# Patient Record
Sex: Female | Born: 1980 | Race: White | Hispanic: No | Marital: Married | Smoking: Former smoker
Health system: Southern US, Community
[De-identification: ages and names within clinical notes are randomized; demographics above are authoritative.]

---

## 2016-06-13 ENCOUNTER — Encounter: Payer: Self-pay | Admitting: *Deleted

## 2016-06-13 ENCOUNTER — Emergency Department
Admission: EM | Admit: 2016-06-13 | Discharge: 2016-06-13 | Disposition: A | Discharge status: Home / Self Care | Payer: 59 | Attending: Emergency Medicine | Admitting: Emergency Medicine

## 2016-06-13 DIAGNOSIS — F17210 Nicotine dependence, cigarettes, uncomplicated: Secondary | ICD-10-CM | POA: Diagnosis not present

## 2016-06-13 DIAGNOSIS — R112 Nausea with vomiting, unspecified: Secondary | ICD-10-CM

## 2016-06-13 DIAGNOSIS — E860 Dehydration: Secondary | ICD-10-CM | POA: Insufficient documentation

## 2016-06-13 DIAGNOSIS — R197 Diarrhea, unspecified: Secondary | ICD-10-CM

## 2016-06-13 LAB — URINALYSIS COMPLETE WITH MICROSCOPIC (ARMC ONLY)
BILIRUBIN URINE: NEGATIVE
Bacteria, UA: NONE SEEN
Glucose, UA: 50 mg/dL — AB
Leukocytes, UA: NEGATIVE
Nitrite: NEGATIVE
PH: 6 (ref 5.0–8.0)
PROTEIN: 30 mg/dL — AB
Specific Gravity, Urine: 1.027 (ref 1.005–1.030)

## 2016-06-13 LAB — CBC WITH DIFFERENTIAL/PLATELET
Basophils Absolute: 0 10*3/uL (ref 0–0.1)
Basophils Relative: 0 %
EOS ABS: 0 10*3/uL (ref 0–0.7)
EOS PCT: 0 %
HCT: 42.2 % (ref 35.0–47.0)
HEMOGLOBIN: 14.3 g/dL (ref 12.0–16.0)
LYMPHS ABS: 0.9 10*3/uL — AB (ref 1.0–3.6)
Lymphocytes Relative: 7 %
MCH: 28.8 pg (ref 26.0–34.0)
MCHC: 34 g/dL (ref 32.0–36.0)
MCV: 84.8 fL (ref 80.0–100.0)
MONO ABS: 0.3 10*3/uL (ref 0.2–0.9)
MONOS PCT: 2 %
NEUTROS PCT: 91 %
Neutro Abs: 12.7 10*3/uL — ABNORMAL HIGH (ref 1.4–6.5)
Platelets: 183 10*3/uL (ref 150–440)
RBC: 4.98 MIL/uL (ref 3.80–5.20)
RDW: 14 % (ref 11.5–14.5)
WBC: 14 10*3/uL — ABNORMAL HIGH (ref 3.6–11.0)

## 2016-06-13 LAB — BASIC METABOLIC PANEL
Anion gap: 9 (ref 5–15)
BUN: 14 mg/dL (ref 6–20)
CALCIUM: 9.4 mg/dL (ref 8.9–10.3)
CHLORIDE: 105 mmol/L (ref 101–111)
CO2: 23 mmol/L (ref 22–32)
CREATININE: 0.69 mg/dL (ref 0.44–1.00)
GFR calc Af Amer: >60 mL/min (ref >60)
GFR calc non Af Amer: >60 mL/min (ref >60)
Glucose, Bld: 173 mg/dL — ABNORMAL HIGH (ref 65–99)
Potassium: 4.3 mmol/L (ref 3.5–5.1)
SODIUM: 137 mmol/L (ref 135–145)

## 2016-06-13 LAB — POCT PREGNANCY, URINE: Preg Test, Ur: NEGATIVE

## 2016-06-13 LAB — MAGNESIUM: MAGNESIUM: 2.1 mg/dL (ref 1.7–2.4)

## 2016-06-13 MED ORDER — PROMETHAZINE HCL 25 MG/ML IJ SOLN
INTRAMUSCULAR | Status: AC
  Administered 2016-06-13: 12.5 mg via INTRAVENOUS
  Filled 2016-06-13: qty 1

## 2016-06-13 MED ORDER — KETOROLAC TROMETHAMINE 30 MG/ML IJ SOLN
30.0000 mg | Freq: Once | INTRAMUSCULAR | Status: AC
  Administered 2016-06-13: 30 mg via INTRAVENOUS
  Filled 2016-06-13: qty 1

## 2016-06-13 MED ORDER — ONDANSETRON HCL 4 MG/2ML IJ SOLN
4.0000 mg | Freq: Once | INTRAMUSCULAR | Status: AC
  Administered 2016-06-13: 4 mg via INTRAVENOUS
  Filled 2016-06-13: qty 2

## 2016-06-13 MED ORDER — METOCLOPRAMIDE HCL 5 MG/ML IJ SOLN
10.0000 mg | Freq: Once | INTRAMUSCULAR | Status: AC
  Administered 2016-06-13: 10 mg via INTRAVENOUS
  Filled 2016-06-13: qty 2

## 2016-06-13 MED ORDER — SODIUM CHLORIDE 0.9 % IV BOLUS (SEPSIS)
2000.0000 mL | Freq: Once | INTRAVENOUS | Status: AC
  Administered 2016-06-13: 2000 mL via INTRAVENOUS

## 2016-06-13 MED ORDER — PROMETHAZINE HCL 25 MG/ML IJ SOLN
12.5000 mg | Freq: Once | INTRAMUSCULAR | Status: AC
  Administered 2016-06-13: 12.5 mg via INTRAVENOUS

## 2016-06-13 MED ORDER — METOCLOPRAMIDE HCL 10 MG PO TABS: 10.0000 mg | ORAL_TABLET | Freq: Three times a day (TID) | ORAL | 1 refills | Status: DC

## 2016-06-13 NOTE — ED Provider Notes (Signed)
[facility] Emergency Department Provider Note  ____________________________________________  Time seen: Approximately 7:32 AM  I have reviewed the triage vital signs and the nursing notes.   HISTORY  Chief Complaint Emesis   HPI Eileen Weber is a 35 y.o. female no significant past medical history who presents for evaluation of nausea, vomiting, and diarrhea. Patient reports that she has had more than 20 episodes of nonbloody nonbilious emesis and 4-5 episodes of watery diarrhea. She reports that she seemed bright red blood in her diarrhea but no blood or coffee ground emesis. She denies abdominal pain. She endorses chills and body aches. Denies fever, dysuria, hematuria, chest pain, shortness of breath. She reports a one of her coworkers is sick and that her son was sick a few weeks ago. No recent antibiotic use. She hasn't tried anything at home for these symptoms. She reports that she's been having vomiting and diarrhea since yesterday 10 PM.  History reviewed. No pertinent past medical history.  There are no active problems to display for this patient.   History reviewed. No pertinent surgical history.  Prior to Admission medications   Medication Sig Start Date End Date Taking? Authorizing Provider  metoCLOPramide (REGLAN) 10 MG tablet Take 1 tablet (10 mg total) by mouth 3 (three) times daily with meals. 06/13/16 06/13/17  Nita Sickle, MD    Allergies Sulfa antibiotics  No family history on file.  Social History Social History  Substance Use Topics  . Smoking status: Current Every Day Smoker    Packs/day: 1.00    Types: Cigarettes  . Smokeless tobacco: Current User  . Alcohol use No    Review of Systems  Constitutional: Negative for fever. + chills, body aches Eyes: Negative for visual changes. ENT: Negative for sore throat. Cardiovascular: Negative for chest pain. Respiratory: Negative for shortness of breath. Gastrointestinal:  Negative for abdominal pain. +vomiting and diarrhea. Genitourinary: Negative for dysuria. Musculoskeletal: Negative for back pain. Skin: Negative for rash. Neurological: Negative for headaches, weakness or numbness.  ____________________________________________   PHYSICAL EXAM:  VITAL SIGNS: ED Triage Vitals  Enc Vitals Group     BP 06/13/16 0714 127/69     Pulse Rate 06/13/16 0714 (!) 57     Resp --      Temp 06/13/16 0714 97.5 F (36.4 C)     Temp Source 06/13/16 0714 Oral     SpO2 06/13/16 0714 100 %     Weight 06/13/16 0709 120 lb (54.4 kg)     Height 06/13/16 0709 5\' 6"  (1.676 m)     Head Circumference --      Peak Flow --      Pain Score 06/13/16 0709 5     Pain Loc --      Pain Edu? --      Excl. in GC? --     Constitutional: Alert and oriented. Well appearing and in no apparent distress. HEENT:      Head: Normocephalic and atraumatic.         Eyes: Conjunctivae are normal. Sclera is non-icteric. EOMI. PERRL      Mouth/Throat: Mucous membranes are dry.       Neck: Supple with no signs of meningismus. Cardiovascular: Regular rate and rhythm. No murmurs, gallops, or rubs. 2+ symmetrical distal pulses are present in all extremities. No JVD. Respiratory: Normal respiratory effort. Lungs are clear to auscultation bilaterally. No wheezes, crackles, or rhonchi.  Gastrointestinal: Soft, non tender, and non distended with positive bowel sounds.  No rebound or guarding. Genitourinary: No CVA tenderness. Musculoskeletal: Nontender with normal range of motion in all extremities. No edema, cyanosis, or erythema of extremities. Neurologic: Normal speech and language. Face is symmetric. Moving all extremities. No gross focal neurologic deficits are appreciated. Skin: Skin is warm, dry and intact. No rash noted. Psychiatric: Mood and affect are normal. Speech and behavior are normal.  ____________________________________________   LABS (all labs ordered are listed, but only  abnormal results are displayed)  Labs Reviewed  CBC WITH DIFFERENTIAL/PLATELET - Abnormal; Notable for the following:       Result Value   WBC 14.0 (*)    Neutro Abs 12.7 (*)    Lymphs Abs 0.9 (*)    All other components within normal limits  BASIC METABOLIC PANEL - Abnormal; Notable for the following:    Glucose, Bld 173 (*)    All other components within normal limits  URINALYSIS COMPLETEWITH MICROSCOPIC ([facility] ONLY) - Abnormal; Notable for the following:    Color, Urine YELLOW (*)    APPearance CLEAR (*)    Glucose, UA 50 (*)    Ketones, ur 1+ (*)    Hgb urine dipstick 1+ (*)    Protein, ur 30 (*)    Squamous Epithelial / LPF 0-5 (*)    All other components within normal limits  MAGNESIUM  POC URINE PREG, ED  POCT PREGNANCY, URINE   ____________________________________________  EKG  none ____________________________________________  RADIOLOGY  none  ____________________________________________   PROCEDURES  Procedure(s) performed: None Procedures Critical Care performed:  None ____________________________________________   INITIAL IMPRESSION / ASSESSMENT AND PLAN / ED COURSE  35 y.o. female no significant past medical history who presents for evaluation of nausea, vomiting, diarrhea, chills, and body aches since yesterday 10PM. Patient is well appearing, looks dehydrated, vital signs are within normal limits, her abdomen is soft and nontender throughout, remaining of her exam is within normal limits. Presentation concerning for gastroenteritis. Plan for CBC, BMP, magnesium, UA, U pregnant. We'll treat her symptoms with IV fluids, IV Zofran, and IV Toradol.  Clinical Course  Comment By Time  Patient reports that she feels markedly improved. Tolerating by mouth. No further episodes fo diarrhea here. She is requesting to go home. Labs here within normal limits. We'll discharge home with a prescription for anti-medics. Nita Sicklearolina Carma Dwiggins, MD 08/30 1201     Pertinent labs & imaging results that were available during my care of the patient were reviewed by me and considered in my medical decision making (see chart for details).    ____________________________________________   FINAL CLINICAL IMPRESSION(S) / ED DIAGNOSES  Final diagnoses:  Nausea vomiting and diarrhea  Dehydration      NEW MEDICATIONS STARTED DURING THIS VISIT:  New Prescriptions   METOCLOPRAMIDE (REGLAN) 10 MG TABLET    Take 1 tablet (10 mg total) by mouth 3 (three) times daily with meals.     Note:  This document was prepared using Dragon voice recognition software and may include unintentional dictation errors.    Nita Sicklearolina Renatha Rosen, MD 06/13/16 [phone]

## 2016-06-13 NOTE — ED Triage Notes (Signed)
Pt reports vomiting since last night at 10pm with diarrhea, body aches and chills

## 2016-06-13 NOTE — Discharge Instructions (Signed)
Drink plenty of fluids to prevent it from getting dehydrated. Take nausea medication as prescribed. Advance your diet slowly. Follow-up with primary care doctor in 3 days. Return to the emergency room if you have bloody diarrhea, Abdominal pain, or if you're unable to keep yourself hydrated at home.

## 2016-06-13 NOTE — ED Notes (Signed)
Pt c/o nausea returning, MD notified.

## 2017-06-05 ENCOUNTER — Emergency Department
Admission: EM | Admit: 2017-06-05 | Discharge: 2017-06-05 | Disposition: A | Discharge status: Home / Self Care | Payer: 59 | Attending: Emergency Medicine | Admitting: Emergency Medicine

## 2017-06-05 ENCOUNTER — Encounter: Payer: Self-pay | Admitting: Emergency Medicine

## 2017-06-05 DIAGNOSIS — K047 Periapical abscess without sinus: Secondary | ICD-10-CM | POA: Diagnosis not present

## 2017-06-05 DIAGNOSIS — K0889 Other specified disorders of teeth and supporting structures: Secondary | ICD-10-CM

## 2017-06-05 DIAGNOSIS — Z79899 Other long term (current) drug therapy: Secondary | ICD-10-CM | POA: Diagnosis not present

## 2017-06-05 DIAGNOSIS — F17210 Nicotine dependence, cigarettes, uncomplicated: Secondary | ICD-10-CM | POA: Insufficient documentation

## 2017-06-05 MED ORDER — IBUPROFEN 800 MG PO TABS: 800.0000 mg | ORAL_TABLET | Freq: Three times a day (TID) | ORAL | 0 refills | Status: DC | PRN

## 2017-06-05 MED ORDER — AMOXICILLIN 500 MG PO TABS: 500.0000 mg | ORAL_TABLET | Freq: Two times a day (BID) | ORAL | 0 refills | Status: DC

## 2017-06-05 MED ORDER — CHLORHEXIDINE GLUCONATE 0.12 % MT SOLN: 15.0000 mL | Freq: Two times a day (BID) | OROMUCOSAL | 0 refills | Status: DC

## 2017-06-05 MED ORDER — CHLORHEXIDINE GLUCONATE 0.12 % MT SOLN: 15.0000 mL | Freq: Two times a day (BID) | OROMUCOSAL | 0 refills | Status: AC

## 2017-06-05 MED ORDER — AMOXICILLIN-POT CLAVULANATE 875-125 MG PO TABS: 1.0000 | ORAL_TABLET | Freq: Two times a day (BID) | ORAL | 0 refills | Status: DC

## 2017-06-05 MED ORDER — LIDOCAINE-EPINEPHRINE 2 %-1:100000 IJ SOLN
1.7000 mL | Freq: Once | INTRAMUSCULAR | Status: DC
  Filled 2017-06-05: qty 1.7

## 2017-06-05 NOTE — Discharge Instructions (Signed)
OPTIONS FOR DENTAL FOLLOW UP CARE ° °[place] Department of Health and Human Services - Local Safety Net Dental Clinics °http://www.ncdhhs.gov/dph/oralhealth/services/safetynetclinics.htm °  °[facility] (336-562-3123) ° °[facility] [place] ([phone]) ° °[facility] ([phone] ext [phone]) ° °[facility] ([phone]) ° °[facility] ([phone]) °This clinic caters to the indigent population and is on a lottery system. °Location: °[facility], [address], [place] °Clinic Hours: °Wednesdays from 6pm - 9pm, patients seen by a lottery system. °For dates, call or go to www.med.[facility].edu/[facility]/patients/Dental-[facility] °Services: °Cleanings, fillings and simple extractions. °Payment Options: °DENTAL WORK IS FREE OF CHARGE. Bring proof of income or support. °Best way to get seen: °Arrive at 5:15 pm - this is a lottery, NOT first come/first serve, so arriving earlier will not increase your chances of being seen. °  °  °[facility] °[phone] °Select option 1 for emergencies °  °Location: °[facility], [address], [place] °Clinic Hours: °No walk-ins accepted - call the day before to schedule an appointment. °Check in times are 9:30 am and 1:30 pm. °Services: °Simple extractions, temporary fillings, pulpectomy/pulp debridement, uncomplicated abscess drainage. °Payment Options: °PAYMENT IS DUE AT THE TIME OF SERVICE.  Fee is usually $100-200, additional surgical procedures (e.g. abscess drainage) may be extra. °Cash, checks, Visa/MasterCard accepted.  Can file Medicaid if patient is covered for dental - patient should call case worker to check. °No discount for [facility] patients. °Best way to get seen: °MUST call the day before and get onto the schedule. Can usually be seen the next 1-2 days. No walk-ins accepted. °  °  °[facility] °[phone] °   °Location: °[facility], [address], [place] °Clinic Hours: °M, W, Th, F 8am or 1:30pm, Tues 9a or 1:30 - first come/first served. °Services: °Simple extractions, temporary fillings, uncomplicated abscess drainage.  You do not need to be an [place] resident. °Payment Options: °PAYMENT IS DUE AT THE TIME OF SERVICE. °Dental insurance, otherwise sliding scale - bring proof of income or support. °Depending on income and treatment needed, cost is usually $50-200. °Best way to get seen: °Arrive early as it is first come/first served. °  °  °[facility] °[phone] °  °Location: °[address] °Clinic Hours: °Mon-Thu 8a-5p °Services: °Most basic dental services including extractions and fillings. °Payment Options: °PAYMENT IS DUE AT THE TIME OF SERVICE. °Sliding scale, up to 50% off - bring proof if income or support. °Medicaid with dental option accepted. °Best way to get seen: °Call to schedule an appointment, can usually be seen within 2 weeks OR they will try to see walk-ins - show up at 8a or 2p (you may have to wait). °  °  °[facility] °[phone] °[place] RESIDENTS ONLY °  °Location: °[facility], [address], [place], [place] [address] °Clinic Hours: By appointment only. °Monday - Thursday 8am-5pm, Friday 8am-12pm °Services: Cleanings, fillings, extractions. °Payment Options: °PAYMENT IS DUE AT THE TIME OF SERVICE. °Cash, Visa or MasterCard. Sliding scale - $30 minimum per service. °Best way to get seen: °Come in to office, complete packet and make an appointment - need proof of income °or support monies for each household member and proof of [place] residence. °Usually takes about a month to get in. °  °  °[facility] °[phone] °  °Location: °[address]   [place] °Clinic Hours: Walk-in Urgent Care Dental Services are offered Monday-Friday  mornings only. °The numbers of emergencies accepted daily is limited to the number of °providers available. °Maximum 15 - Mondays, Wednesdays & Thursdays °Maximum 10 - Tuesdays & Fridays °Services: °You do not need to be a [place] resident to be seen for a dental emergency. °Emergencies are defined as pain, swelling, abnormal bleeding, or dental trauma. Walkins will receive x-rays if needed. °NOTE: Dental cleaning is not an emergency. °Payment Options: °PAYMENT IS DUE AT THE TIME OF SERVICE. °Minimum co-pay is $40.00 for uninsured patients. °Minimum co-pay is $3.00 for Medicaid with dental coverage. °Dental Insurance is accepted and must be presented at time of visit. °Medicare does not cover dental. °Forms of payment: Cash, credit card, checks. °Best way to get seen: °If not previously registered with the clinic, walk-in dental registration begins at 7:15 am and is on a first come/first serve basis. °If previously registered with the clinic, call to make an appointment. °  °  °[facility] °[phone] °[place] RESIDENTS ONLY °  °Location: °[address], [place], [place] °Clinic Hours: °Mon-Thu 10a-2p °Services: Extractions only! °Payment Options: °FREE (donations accepted) - bring proof of income or support °Best way to get seen: °Call and schedule an appointment OR come at 8am on the 1st Monday of every month (except for holidays) when it is first come/first served. °  °  °[facility] °[phone] °  °Location: °[address], [place] °Clinic Hours: °Friday mornings °Services, Payment Options, Best way to get seen: °Call for info °

## 2017-06-05 NOTE — ED Triage Notes (Signed)
Pt in via POV with complaints of right upper dental pain x one day.  Facial swelling noted to right side of face.  Pt reports broken tooth to that area, reports recurrent infections. Vitals WDL.

## 2017-06-05 NOTE — ED Provider Notes (Signed)
[facility] Emergency Department Provider Note   ____________________________________________   I have reviewed the triage vital signs and the nursing notes.   HISTORY  Chief Complaint Dental Pain    HPI Eileen Weber is a 36 y.o. female presents to the emergency deparment of right upper jaw dental pain. Patient reports dental history significant for dental caries and broken teeth leading up to current symptoms. Patient noted the right upper jaw had been bothering her for a while with recent increase in pain and swelling over the last 24 hours. Patient realize she had been needing to go to the dentis tfor a while however she had family issues that have been  Preventing her from following up with the dentist. Patient reports remote history of dental infection and abscess. Patient localizes current pain along the right upper jaw approximately premolars to the first molar. Patient reports tenderness along the right upper cheek with headache. She describes pain with a sense of pressure. Patient denies fever, chills, headache, vision changes, chest pain, chest tightness, shortness of breath, abdominal pain, nausea and vomiting.  History reviewed. No pertinent past medical history.  There are no active problems to display for this patient.   History reviewed. No pertinent surgical history.  Prior to Admission medications   Medication Sig Start Date End Date Taking? Authorizing Provider  amoxicillin (AMOXIL) 500 MG tablet Take 1 tablet (500 mg total) by mouth 2 (two) times daily. 06/05/17   Nicholl Onstott M, PA-C  chlorhexidine (PERIDEX) 0.12 % solution Use as directed 15 mLs in the mouth or throat 2 (two) times daily. 06/05/17 06/10/17  Oswald Pott M, PA-C  ibuprofen (ADVIL,MOTRIN) 800 MG tablet Take 1 tablet (800 mg total) by mouth every 8 (eight) hours as needed for moderate pain. 06/05/17   Dorea Duff M, PA-C  metoCLOPramide (REGLAN) 10 MG tablet Take 1 tablet (10  mg total) by mouth 3 (three) times daily with meals. 06/13/16 06/13/17  Nita Sickle, MD    Allergies Sulfa antibiotics  No family history on file.  Social History Social History  Substance Use Topics  . Smoking status: Current Every Day Smoker    Packs/day: 1.00    Types: Cigarettes  . Smokeless tobacco: Current User  . Alcohol use No    Review of Systems Constitutional: Negative for fever/chills Eyes: No visual changes. ENT:  Negative for sore throat and for difficulty swallowing. Right upper gumline swelling with dental pain secondary to abscess and broken tooth. Cardiovascular: Denies chest pain. Respiratory: Denies cough. Denies shortness of breath. Gastrointestinal: No abdominal pain.  No nausea, vomiting, diarrhea. Genitourinary: Negative for dysuria. Musculoskeletal: Negative for back pain. Skin: Negative for rash. Neurological: Negative for headaches.  Negative focal weakness or numbness. Negative for loss of consciousness. Able to ambulate. ____________________________________________   PHYSICAL EXAM:  VITAL SIGNS: Patient Vitals for the past 24 hrs:  BP Temp Temp src Pulse Resp SpO2 Height Weight  06/05/17 1218 106/76 98.5 F (36.9 C) Oral 78 16 98 % 5\' 6"  (1.676 m) 54.4 kg (120 lb)    Constitutional: Alert and oriented. Well appearing and in no acute distress.  Eyes: Conjunctivae are normal. PERRL. EOMI  Head: Normocephalic and atraumatic. ENT:      Ears: Canals clear. TMs intact bilaterally.      Nose: No congestion/rhinnorhea.      Mouth/Throat: Mucous membranes are moist. Oropharynx non-erythematous. Right upper gumline line erythematous, edematous. Noted broken 1st and 2nd molars with significant dental caries. Buccal mucosa  edematous without induration.  Neck:Supple. No stridor.  Cardiovascular: Normal rate, regular rhythm. Normal S1 and S2.  Good peripheral circulation. Respiratory: Normal respiratory effort without tachypnea or retractions.  Lungs CTAB. No wheezes/rales/rhonchi.  Hematological/Lymphatic/Immunological: No cervical lymphadenopathy. Cardiovascular: Normal rate, regular rhythm. Normal distal pulses. Gastrointestinal: Bowel sounds 4 quadrants. Soft and nontender to palpation. Musculoskeletal: Nontender with normal range of motion in all extremities. Neurologic: Normal speech and language.  Skin:  Skin is warm, dry and intact. No rash noted. Psychiatric: Mood and affect are normal. Speech and behavior are normal. Patient exhibits appropriate insight and judgement.  ____________________________________________   LABS (all labs ordered are listed, but only abnormal results are displayed)  Labs Reviewed - No data to display ____________________________________________  EKG none ____________________________________________  RADIOLOGY none ____________________________________________   PROCEDURES  Procedure(s) performed:  DENTAL BLOCK  Performed by: Clois Comber Consent: Verbal consent obtained. Required items: devices and special equipment available Time out: Immediately prior to procedure a "time out" was called to verify the correct patient, procedure, equipment, support staff and site/side marked as required.  Indication: right upper molar dental pain with infection. Nerve block body site: posterior-superior alveolar nerve block  Preparation: Patient was prepped and draped in the usual sterile fashion. Needle gauge: 27 G Location technique: height of the muccobuccal fold between the first and second molar  Local anesthetic: Xylocaine with epi  Anesthetic total: 1.7 ml  Outcome: pain improved Patient tolerance: Patient tolerated the procedure well with no immediate complications.   Critical Care performed: no ____________________________________________   INITIAL IMPRESSION / ASSESSMENT AND PLAN / ED COURSE  Pertinent labs & imaging results that were available during my care of the  patient were reviewed by me and considered in my medical decision making (see chart for details).  Patient presents to emergency department right first molar dental pain. History and physical exam findings are reassuring symptoms are consistent with dental pain associated with dental infection and abscess. Patient responded well dental block during the course of care in the emergency department. See procedure note above. Patient will be prescribed amoxicillin for antibiotic coverage, chlorhexidine mouthwash and ibuprofen for pain and inflammation.Patient advised to follow up with a dental clinic for dental caries and the affected tooth or return to the emergency department if symptoms return or worsen. Patient informed of clinical course, understand medical decision-making process, and agree with plan.    ____________________________________________   FINAL CLINICAL IMPRESSION(S) / ED DIAGNOSES  Final diagnoses:  Dental abscess  Pain, dental       NEW MEDICATIONS STARTED DURING THIS VISIT:  Discharge Medication List as of 06/05/2017  1:18 PM    START taking these medications   Details  amoxicillin (AMOXIL) 500 MG tablet Take 1 tablet (500 mg total) by mouth 2 (two) times daily., Starting Wed 06/05/2017, Print         Note:  This document was prepared using Dragon voice recognition software and may include unintentional dictation errors.    Clois Comber, PA-C 06/05/17 1405    Rockne Menghini, MD 06/05/17 [phone]

## 2018-03-21 ENCOUNTER — Encounter: Payer: Self-pay | Admitting: Emergency Medicine

## 2018-03-21 DIAGNOSIS — E860 Dehydration: Secondary | ICD-10-CM | POA: Insufficient documentation

## 2018-03-21 DIAGNOSIS — F17210 Nicotine dependence, cigarettes, uncomplicated: Secondary | ICD-10-CM | POA: Insufficient documentation

## 2018-03-21 DIAGNOSIS — Z79899 Other long term (current) drug therapy: Secondary | ICD-10-CM | POA: Insufficient documentation

## 2018-03-21 LAB — URINALYSIS, COMPLETE (UACMP) WITH MICROSCOPIC
BACTERIA UA: NONE SEEN
Glucose, UA: NEGATIVE mg/dL
KETONES UR: 5 mg/dL — AB
LEUKOCYTES UA: NEGATIVE
Nitrite: NEGATIVE
PROTEIN: 30 mg/dL — AB
SPECIFIC GRAVITY, URINE: 1.034 — AB (ref 1.005–1.030)
pH: 6 (ref 5.0–8.0)

## 2018-03-21 LAB — CBC
HEMATOCRIT: 41.1 % (ref 35.0–47.0)
HEMOGLOBIN: 13.8 g/dL (ref 12.0–16.0)
MCH: 29.1 pg (ref 26.0–34.0)
MCHC: 33.6 g/dL (ref 32.0–36.0)
MCV: 86.5 fL (ref 80.0–100.0)
Platelets: 231 10*3/uL (ref 150–440)
RBC: 4.75 MIL/uL (ref 3.80–5.20)
RDW: 14.4 % (ref 11.5–14.5)
WBC: 7.4 10*3/uL (ref 3.6–11.0)

## 2018-03-21 LAB — BASIC METABOLIC PANEL
ANION GAP: 9 (ref 5–15)
BUN: 10 mg/dL (ref 6–20)
CHLORIDE: 103 mmol/L (ref 101–111)
CO2: 26 mmol/L (ref 22–32)
Calcium: 9.3 mg/dL (ref 8.9–10.3)
Creatinine, Ser: 0.71 mg/dL (ref 0.44–1.00)
GFR calc Af Amer: >60 mL/min (ref >60)
GLUCOSE: 117 mg/dL — AB (ref 65–99)
POTASSIUM: 3.2 mmol/L — AB (ref 3.5–5.1)
Sodium: 138 mmol/L (ref 135–145)

## 2018-03-21 NOTE — ED Triage Notes (Signed)
Patient states that she recently had a cold and has been feeling better. Patient states that she went to work tonight and started feeling dizzy and felt like she was going to pass out.

## 2018-03-22 ENCOUNTER — Emergency Department
Admission: EM | Admit: 2018-03-22 | Discharge: 2018-03-22 | Disposition: A | Discharge status: Home / Self Care | Payer: 59 | Attending: Emergency Medicine | Admitting: Emergency Medicine

## 2018-03-22 DIAGNOSIS — E860 Dehydration: Secondary | ICD-10-CM

## 2018-03-22 DIAGNOSIS — R42 Dizziness and giddiness: Secondary | ICD-10-CM

## 2018-03-22 LAB — PREGNANCY, URINE: Preg Test, Ur: NEGATIVE

## 2018-03-22 NOTE — ED Provider Notes (Signed)
[facility] Emergency Department Provider Note  Time seen: 1:35 AM  I have reviewed the triage vital signs and the nursing notes.   HISTORY  Chief Complaint Dizziness    HPI Adrienne MochaSarah Mcilvaine is a 37 y.o. female with no past medical history who presents to the emergency department for near syncope.  According to the patient she was at work tonight she began feeling lightheaded and felt like her vision was fading.  Patient states she try to sit down but the symptoms did not improve.  Tried to eat something but she did not improve so she left work and went to the emergency department.  Patient denies any chest pain or trouble breathing.  States she has had an upper respiratory infection recently but stopped taking medications for this several days ago.  States her symptoms have nearly resolved states mild congestion.  Last menstrual period just ended 1 week ago.   History reviewed. No pertinent past medical history.  There are no active problems to display for this patient.   History reviewed. No pertinent surgical history.  Prior to Admission medications   Medication Sig Start Date End Date Taking? Authorizing Provider  amoxicillin (AMOXIL) 500 MG tablet Take 1 tablet (500 mg total) by mouth 2 (two) times daily. 06/05/17   Little, Traci M, PA-C  ibuprofen (ADVIL,MOTRIN) 800 MG tablet Take 1 tablet (800 mg total) by mouth every 8 (eight) hours as needed for moderate pain. 06/05/17   Little, Traci M, PA-C  metoCLOPramide (REGLAN) 10 MG tablet Take 1 tablet (10 mg total) by mouth 3 (three) times daily with meals. 06/13/16 06/13/17  Nita SickleVeronese, Chuichu, MD    Allergies  Allergen Reactions  . Sulfa Antibiotics Hives    No family history on file.  Social History Social History   Tobacco Use  . Smoking status: Current Every Day Smoker    Packs/day: 1.00    Types: Cigarettes  . Smokeless tobacco: Current User  Substance Use Topics  . Alcohol use: No  . Drug use:  Never    Review of Systems Constitutional: Negative for fever. ENT: Negative for recent illness/congestion Cardiovascular: Negative for chest pain. Respiratory: Negative for shortness of breath. Gastrointestinal: Negative for abdominal pain, vomiting  Genitourinary: Negative for urinary compaints Musculoskeletal: Negative for musculoskeletal complaints Skin: Negative for skin complaints  Neurological: Negative for headache All other ROS negative  ____________________________________________   PHYSICAL EXAM:  VITAL SIGNS: ED Triage Vitals  Enc Vitals Group     BP 03/21/18 2224 129/74     Pulse Rate 03/21/18 2224 68     Resp 03/21/18 2224 18     Temp 03/21/18 2224 98 F (36.7 C)     Temp src --      SpO2 03/21/18 2224 100 %     Weight 03/21/18 2231 115 lb (52.2 kg)     Height 03/21/18 2231 5\' 6"  (1.676 m)     Head Circumference --      Peak Flow --      Pain Score 03/21/18 2231 0     Pain Loc --      Pain Edu? --      Excl. in GC? --    Constitutional: Alert and oriented. Well appearing and in no distress. Eyes: Normal exam ENT   Head: Normocephalic and atraumatic   Mouth/Throat: Mucous membranes are moist. Cardiovascular: Normal rate, regular rhythm. No murmur Respiratory: Normal respiratory effort without tachypnea nor retractions. Breath sounds are clear  Gastrointestinal:  Soft and nontender. No distention.  Musculoskeletal: Nontender with normal range of motion in all extremities. Neurologic:  Normal speech and language. No gross focal neurologic deficits  Skin:  Skin is warm, dry and intact.  Psychiatric: Mood and affect are normal.   ____________________________________________    EKG  EKG reviewed and interpreted by myself shows normal sinus rhythm at 64 bpm, narrow QRS, normal axis, normal intervals, no concerning ST changes.   INITIAL IMPRESSION / ASSESSMENT AND PLAN / ED COURSE  Pertinent labs & imaging results that were available during my  care of the patient were reviewed by me and considered in my medical decision making (see chart for details).  Patient presents to the emergency department for dizziness/near syncope.  Differential would include dehydration, pregnancy, electrolyte abnormality, anemia.  Patient's labs have resulted largely within normal limits besides a urinalysis consistent with dehydration showing a mild amount of ketones and a concentrated specific gravity.  I discussed IV hydration versus oral hydration.  Patient strongly wishes to go home and rest and orally hydrate.  Patient's pregnancy test is negative.  We will discharge patient home, with supportive care.  ____________________________________________   FINAL CLINICAL IMPRESSION(S) / ED DIAGNOSES  Dehydration Dizziness    Minna Antis, MD 03/22/18 [phone]

## 2019-02-11 ENCOUNTER — Encounter: Payer: Self-pay | Admitting: Emergency Medicine

## 2019-02-11 ENCOUNTER — Ambulatory Visit (INDEPENDENT_AMBULATORY_CARE_PROVIDER_SITE_OTHER): Payer: Self-pay

## 2019-02-11 ENCOUNTER — Ambulatory Visit
Admission: EM | Admit: 2019-02-11 | Discharge: 2019-02-11 | Disposition: A | Discharge status: Home / Self Care | Payer: Self-pay | Attending: Emergency Medicine | Admitting: Emergency Medicine

## 2019-02-11 ENCOUNTER — Other Ambulatory Visit: Payer: Self-pay

## 2019-02-11 DIAGNOSIS — R079 Chest pain, unspecified: Secondary | ICD-10-CM

## 2019-02-11 DIAGNOSIS — R0781 Pleurodynia: Secondary | ICD-10-CM

## 2019-02-11 DIAGNOSIS — R0782 Intercostal pain: Secondary | ICD-10-CM

## 2019-02-11 MED ORDER — CYCLOBENZAPRINE HCL 10 MG PO TABS: 10.0000 mg | ORAL_TABLET | Freq: Two times a day (BID) | ORAL | 0 refills | Status: DC | PRN

## 2019-02-11 MED ORDER — MELOXICAM 15 MG PO TABS: 15.0000 mg | ORAL_TABLET | Freq: Every day | ORAL | 0 refills | Status: DC | PRN

## 2019-02-11 NOTE — ED Provider Notes (Signed)
[facility] ____________________________________________  Time seen: Approximately 3:01 PM  I have reviewed the triage vital signs and the nursing notes.   HISTORY  Chief Complaint Back Pain   HPI Eileen Weber is a 38 y.o. female presenting for evaluation of right rib pain present for the last 4 days.  Patient reports pain onset was when she sneezed.  Denies any fall, direct injury or direct trauma.  States pain has been consistently to her right back area, and reports pain is predominantly with movement and occasionally radiates to her right lateral chest.  Unresolved with Tylenol.  States that heating pad does help some.  States pain is fully reproducible by movement and palpation.  States staying completely still also helps some.  Denies chest pain or shortness of breath.  Denies fevers, cough or recent sickness.  Reports otherwise doing well.  Denies rash.  Denies renal insufficiency.  Patient's last menstrual period was 02/02/2019 (approximate).  Denies pregnancy   History reviewed. No pertinent past medical history. Denies   There are no active problems to display for this patient.   History reviewed. No pertinent surgical history.   No current facility-administered medications for this encounter.   Current Outpatient Medications:  .  amoxicillin (AMOXIL) 500 MG tablet, Take 1 tablet (500 mg total) by mouth 2 (two) times daily., Disp: 20 tablet, Rfl: 0 .  cyclobenzaprine (FLEXERIL) 10 MG tablet, Take 1 tablet (10 mg total) by mouth 2 (two) times daily as needed for muscle spasms. Do not drive while taking as can cause drowsiness, Disp: 15 tablet, Rfl: 0 .  ibuprofen (ADVIL,MOTRIN) 800 MG tablet, Take 1 tablet (800 mg total) by mouth every 8 (eight) hours as needed for moderate pain., Disp: 20 tablet, Rfl: 0 .  meloxicam (MOBIC) 15 MG tablet, Take 1 tablet (15 mg total) by mouth daily as needed., Disp: 10 tablet, Rfl: 0 .  metoCLOPramide (REGLAN) 10 MG tablet,  Take 1 tablet (10 mg total) by mouth 3 (three) times daily with meals., Disp: 20 tablet, Rfl: 1  Allergies Sulfa antibiotics  Family History  Problem Relation Age of Onset  . Healthy Mother   . COPD Father   . Emphysema Father     Social History Social History   Tobacco Use  . Smoking status: Current Every Day Smoker    Packs/day: 1.00    Types: Cigarettes  . Smokeless tobacco: Current User  Substance Use Topics  . Alcohol use: No  . Drug use: Never    Review of Systems Constitutional: No fever Cardiovascular: Denies chest pain. Respiratory: Denies shortness of breath. Gastrointestinal: No abdominal pain.  No nausea, no vomiting.  No diarrhea.   Genitourinary: Negative for dysuria. Musculoskeletal: Positive right rib back pain. Skin: Negative for rash.   ____________________________________________   PHYSICAL EXAM:  VITAL SIGNS: ED Triage Vitals  Enc Vitals Group     BP 02/11/19 1308 110/72     Pulse Rate 02/11/19 1308 89     Resp 02/11/19 1308 14     Temp 02/11/19 1308 98.4 F (36.9 C)     Temp Source 02/11/19 1308 Oral     SpO2 02/11/19 1308 100 %     Weight 02/11/19 1305 120 lb (54.4 kg)     Height 02/11/19 1305 5\' 6"  (1.676 m)     Head Circumference --      Peak Flow --      Pain Score 02/11/19 1305 7     Pain Loc --  Pain Edu? --      Excl. in GC? --     Constitutional: Alert and oriented. Well appearing and in no acute distress. ENT      Head: Normocephalic and atraumatic. Cardiovascular: Normal rate, regular rhythm. Grossly normal heart sounds.  Good peripheral circulation. Respiratory: Normal respiratory effort without tachypnea nor retractions. Breath sounds are clear and equal bilaterally. No wheezes, rales, rhonchi. Gastrointestinal: Soft and nontender. Musculoskeletal:  No midline cervical, thoracic or lumbar tenderness to palpation. Except: Right lateral to posterior lower ribs moderate tenderness to direct palpation, no rash, no  ecchymosis, no midline tenderness, no anterior chest tenderness, pain reproduced with direct palpation as well as movement. Neurologic:  Normal speech and language. Speech is normal. No gait instability.  Skin:  Skin is warm, dry and intact. No rash noted. Psychiatric: Mood and affect are normal. Speech and behavior are normal. Patient exhibits appropriate insight and judgment   ___________________________________________   LABS (all labs ordered are listed, but only abnormal results are displayed)  Labs Reviewed - No data to display ____________________________________________  RADIOLOGY  Dg Ribs Unilateral W/chest Right  Result Date: 02/11/2019 CLINICAL DATA:  Pain for several days EXAM: RIGHT RIBS AND CHEST - 3+ VIEW COMPARISON:  None. FINDINGS: Frontal chest as well as oblique and cone-down rib images were obtained. Lungs are clear. Heart size and pulmonary vascularity are normal. No adenopathy. There is no evident pneumothorax or pleural effusion. No rib fracture. 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 IMPRESSION: No rib fracture.  Lungs clear.  Upper lumbar levoscoliosis. Electronically Signed   By: Bretta BangWilliam  Woodruff III M.D.   On: 02/11/2019 14:33   ____________________________________________   PROCEDURES Procedures    INITIAL IMPRESSION / ASSESSMENT AND PLAN / ED COURSE  Pertinent labs & imaging results that were available during my care of the patient were reviewed by me and considered in my medical decision making (see chart for details).  Well-appearing patient.  No acute distress.  Right posterior rib pain after sneezing.  Right rib x-ray as above per radiologist, no rib fracture.  Suspect muscle strain.  Will treat with Mobic and Flexeril as needed.  Ice, heat, deep breaths and stretching.  Supportive care.  Work note given for today and tomorrow.Discussed indication, risks and benefits of medications with patient.  Discussed follow up with Primary care physician this  week. Discussed follow up and return parameters including no resolution or any worsening concerns. Patient verbalized understanding and agreed to plan.   ____________________________________________   FINAL CLINICAL IMPRESSION(S) / ED DIAGNOSES  Final diagnoses:  Rib pain on right side     ED Discharge Orders         Ordered    meloxicam (MOBIC) 15 MG tablet  Daily PRN     02/11/19 1454    cyclobenzaprine (FLEXERIL) 10 MG tablet  2 times daily PRN     02/11/19 1454           Note: This dictation was prepared with Dragon dictation along with smaller phrase technology. Any transcriptional errors that result from this process are unintentional.         Renford DillsMiller, Brinda Focht, NP 02/11/19 1604

## 2019-02-11 NOTE — Discharge Instructions (Signed)
Take medication as prescribed. Rest. Drink plenty of fluids. Ice.Heat. Deep breaths.   Follow up with your primary care physician this week as needed. Return to Urgent care for new or worsening concerns.

## 2019-02-11 NOTE — ED Triage Notes (Signed)
Patient c/o mid back pain and rib pain that started after she sneezed 4-5 days ago.  Patient denies cold symptoms.  Patient denies fevers.

## 2019-10-27 ENCOUNTER — Encounter: Payer: Self-pay | Admitting: Emergency Medicine

## 2019-10-27 ENCOUNTER — Other Ambulatory Visit: Payer: Self-pay

## 2019-10-27 ENCOUNTER — Emergency Department
Admission: EM | Admit: 2019-10-27 | Discharge: 2019-10-27 | Disposition: A | Discharge status: Home / Self Care | Payer: Self-pay | Attending: Emergency Medicine | Admitting: Emergency Medicine

## 2019-10-27 DIAGNOSIS — F17228 Nicotine dependence, chewing tobacco, with other nicotine-induced disorders: Secondary | ICD-10-CM | POA: Insufficient documentation

## 2019-10-27 DIAGNOSIS — Z20822 Contact with and (suspected) exposure to covid-19: Secondary | ICD-10-CM | POA: Insufficient documentation

## 2019-10-27 DIAGNOSIS — F17210 Nicotine dependence, cigarettes, uncomplicated: Secondary | ICD-10-CM | POA: Insufficient documentation

## 2019-10-27 DIAGNOSIS — E860 Dehydration: Secondary | ICD-10-CM

## 2019-10-27 DIAGNOSIS — R112 Nausea with vomiting, unspecified: Secondary | ICD-10-CM

## 2019-10-27 LAB — CBC
HCT: 43.5 % (ref 36.0–46.0)
Hemoglobin: 14.1 g/dL (ref 12.0–15.0)
MCH: 28.3 pg (ref 26.0–34.0)
MCHC: 32.4 g/dL (ref 30.0–36.0)
MCV: 87.2 fL (ref 80.0–100.0)
Platelets: 222 10*3/uL (ref 150–400)
RBC: 4.99 MIL/uL (ref 3.87–5.11)
RDW: 13 % (ref 11.5–15.5)
WBC: 10.4 10*3/uL (ref 4.0–10.5)
nRBC: 0 % (ref 0.0–0.2)

## 2019-10-27 LAB — BASIC METABOLIC PANEL
Anion gap: 12 (ref 5–15)
BUN: 13 mg/dL (ref 6–20)
CO2: 23 mmol/L (ref 22–32)
Calcium: 9.6 mg/dL (ref 8.9–10.3)
Chloride: 103 mmol/L (ref 98–111)
Creatinine, Ser: 0.62 mg/dL (ref 0.44–1.00)
GFR calc Af Amer: >60 mL/min (ref >60)
GFR calc non Af Amer: >60 mL/min (ref >60)
Glucose, Bld: 104 mg/dL — ABNORMAL HIGH (ref 70–99)
Potassium: 4.1 mmol/L (ref 3.5–5.1)
Sodium: 138 mmol/L (ref 135–145)

## 2019-10-27 LAB — POC SARS CORONAVIRUS 2 AG: SARS Coronavirus 2 Ag: NEGATIVE

## 2019-10-27 MED ORDER — ONDANSETRON 4 MG PO TBDP: 4.0000 mg | ORAL_TABLET | Freq: Three times a day (TID) | ORAL | 0 refills | Status: DC | PRN

## 2019-10-27 MED ORDER — ONDANSETRON 4 MG PO TBDP
4.0000 mg | ORAL_TABLET | Freq: Once | ORAL | Status: AC
  Administered 2019-10-27: 4 mg via ORAL
  Filled 2019-10-27: qty 1

## 2019-10-27 NOTE — ED Provider Notes (Signed)
[facility] Emergency Department Provider Note       Time seen: ----------------------------------------- 2:19 PM on 10/27/2019 -----------------------------------------   I have reviewed the triage vital signs and the nursing notes.  HISTORY   Chief Complaint Emesis and Nausea    HPI Eileen Weber is a 39 y.o. female with no known past medical history who presents to the ED for nausea vomiting.  Patient states she cannot keep anything down.  She has also been dealing with an upper respiratory infection over the last week.  She now feels somewhat dehydrated but has not vomited since she has been here.  History reviewed. No pertinent past medical history.  There are no problems to display for this patient.   History reviewed. No pertinent surgical history.  Allergies Sulfa antibiotics  Social History Social History   Tobacco Use  . Smoking status: Current Every Day Smoker    Packs/day: 1.00    Types: Cigarettes  . Smokeless tobacco: Current User  Substance Use Topics  . Alcohol use: No  . Drug use: Never   Review of Systems Constitutional: Negative for fever. Cardiovascular: Negative for chest pain. Respiratory: Negative for shortness of breath. Gastrointestinal: Negative for abdominal pain, positive for nausea vomiting Musculoskeletal: Negative for back pain. Skin: Negative for rash. Neurological: Negative for headaches, focal weakness or numbness.  All systems negative/normal/unremarkable except as stated in the HPI  ____________________________________________   PHYSICAL EXAM:  VITAL SIGNS: ED Triage Vitals  Enc Vitals Group     BP 10/27/19 1316 101/67     Pulse Rate 10/27/19 1316 81     Resp 10/27/19 1316 16     Temp 10/27/19 1316 97.8 F (36.6 C)     Temp Source 10/27/19 1316 Oral     SpO2 10/27/19 1316 99 %     Weight 10/27/19 1157 115 lb (52.2 kg)     Height 10/27/19 1157 5\' 6"  (1.676 m)     Head Circumference --     Peak Flow --      Pain Score 10/27/19 1157 0     Pain Loc --      Pain Edu? --      Excl. in GC? --     Constitutional: Alert and oriented. Well appearing and in no distress. Eyes: Conjunctivae are normal. Normal extraocular movements. ENT      Head: Normocephalic and atraumatic.      Nose: No congestion/rhinnorhea.      Mouth/Throat: Mucous membranes are moist.      Neck: No stridor. Cardiovascular: Normal rate, regular rhythm. No murmurs, rubs, or gallops. Respiratory: Normal respiratory effort without tachypnea nor retractions. Breath sounds are clear and equal bilaterally. No wheezes/rales/rhonchi. Gastrointestinal: Soft and nontender. Normal bowel sounds Musculoskeletal: Nontender with normal range of motion in extremities. No lower extremity tenderness nor edema. Neurologic:  Normal speech and language. No gross focal neurologic deficits are appreciated.  Skin:  Skin is warm, dry and intact. No rash noted. Psychiatric: Mood and affect are normal. Speech and behavior are normal.  ____________________________________________  ED COURSE:  As part of my medical decision making, I reviewed the following data within the electronic MEDICAL RECORD NUMBER History obtained from family if available, nursing notes, old chart and ekg, as well as notes from prior ED visits. Patient presented for nausea, and vomiting, we will assess with labs and imaging as indicated at this time.   Procedures  Eileen Weber was evaluated in Emergency Department on 10/27/2019 for the symptoms  described in the history of present illness. She was evaluated in the context of the global COVID-19 pandemic, which necessitated consideration that the patient might be at risk for infection with the SARS-CoV-2 virus that causes COVID-19. Institutional protocols and algorithms that pertain to the evaluation of patients at risk for COVID-19 are in a state of rapid change based on information released by regulatory bodies  including the CDC and federal and state organizations. These policies and algorithms were followed during the patient's care in the ED.  ____________________________________________   LABS (pertinent positives/negatives)  Labs Reviewed  BASIC METABOLIC PANEL - Abnormal; Notable for the following components:      Result Value   Glucose, Bld 104 (*)    All other components within normal limits  CBC  POC SARS CORONAVIRUS 2 AG  POC SARS CORONAVIRUS 2 AG -  ED  ____________________________________________   DIFFERENTIAL DIAGNOSIS   COVID-19, dehydration, URI, pneumonia  FINAL ASSESSMENT AND PLAN  Viral illness   Plan: The patient had presented for viral symptoms. Patient's labs were reassuring.  Repeat Covid test was negative here.  She was feeling better after Zofran.  She is cleared for outpatient follow-up.   Laurence Aly, MD    Note: This note was generated in part or whole with voice recognition software. Voice recognition is usually quite accurate but there are transcription errors that can and very often do occur. I apologize for any typographical errors that were not detected and corrected.     Earleen Newport, MD 10/27/19 1451

## 2019-10-27 NOTE — ED Triage Notes (Signed)
Pt reports has been dealing with an upper resp infection and then last pm started with NV.

## 2019-10-27 NOTE — ED Notes (Signed)
Pt with possible URI - husband dx with same - N/V started yesterday and has been unable to eat or drink. Non productive cough.

## 2019-10-27 NOTE — ED Triage Notes (Signed)
Pt reports started with NV last night and now dehydrated.

## 2019-12-30 ENCOUNTER — Ambulatory Visit: Payer: Self-pay | Attending: Oncology | Admitting: *Deleted

## 2019-12-30 ENCOUNTER — Encounter: Payer: Self-pay | Admitting: *Deleted

## 2019-12-30 ENCOUNTER — Other Ambulatory Visit: Payer: Self-pay

## 2019-12-30 ENCOUNTER — Ambulatory Visit
Admission: RE | Admit: 2019-12-30 | Discharge: 2019-12-30 | Disposition: A | Discharge status: Home / Self Care | Payer: Self-pay | Source: Ambulatory Visit | Attending: Oncology | Admitting: Oncology

## 2019-12-30 VITALS — BP 104/69 | HR 65 | Temp 97.9°F | Ht 65.0 in | Wt 114.0 lb

## 2019-12-30 DIAGNOSIS — Z0000 Encounter for general adult medical examination without abnormal findings: Secondary | ICD-10-CM | POA: Insufficient documentation

## 2019-12-30 NOTE — Patient Instructions (Signed)
Gave patient hand-out, Women Staying Healthy, Active and Well from [facility], with education on breast health, pap smears, heart and colon health. 

## 2019-12-30 NOTE — Progress Notes (Signed)
  Subjective:     Patient ID: Eileen Weber, female   DOB: 12/29/1980, 39 y.o.   MRN: [id number]  HPI   Review of Systems     Objective:   Physical Exam Chest:     Breasts:        Right: No swelling, bleeding, inverted nipple, mass, nipple discharge, skin change or tenderness.        Left: No swelling, bleeding, inverted nipple, mass, nipple discharge, skin change or tenderness.    Abdominal:     Palpations: There is no hepatomegaly or splenomegaly.  Genitourinary:    Exam position: Lithotomy position.     Labia:        Right: No rash, tenderness, lesion or injury.        Left: No rash, tenderness, lesion or injury.      Vagina: No signs of injury and foreign body. No vaginal discharge, erythema, tenderness, bleeding, lesions or prolapsed vaginal walls.     Cervix: No cervical motion tenderness, discharge, friability, lesion, erythema, cervical bleeding or eversion.     Uterus: Not deviated.      Adnexa:        Right: No mass, tenderness or fullness.         Left: No mass, tenderness or fullness.       Rectum: No mass.     Lymphadenopathy:     Upper Body:     Right upper body: No supraclavicular or axillary adenopathy.     Left upper body: No supraclavicular or axillary adenopathy.        Assessment:     39 year old White female presents to [facility] with complaints of a right breast mass found on self exam 2 weeks ago.  Her husband is present during the interview and exam per her request.  Clinical breast exam reveals bilateral breast to have a slight fibroglandular like pattern with greater tissue on the right.  There is no dominant mass, skin changes, nipple discharge or lymphadenopathy.  I had the patient point to the area of concern.  She points to 12:00 at the edge of the areola and states she only feels it sitting up.  I had the patient sit up also.  I can only palpate glandular like tissue and her ribs.  Patient is very thin.  Patient states "I think I am just anxious  because my cousin who is younger than me is having a biopsy today.  She also has a history of breast cancer in her maternal grandmother and 2 maternal aunts.  4 paternal cousins have cervical cancer.  We discussed the option to proceed with a diagnostic mammogram if she thinks she still feels a mass.  She states she thinks I'm just being anxious.  Taught self breast awareness. Specimen collected for pap smear.  Patient has been screened for eligibility.  She does not have any insurance, Medicare or Medicaid.  She also meets financial eligibility.   Risk Assessment    Risk Scores      12/30/2019   Last edited by: Scarlett Presto, RN   5-year risk: 0.4 %   Lifetime risk: 8 %            Plan:     Screening mammogram ordered.  Patient to call if she feels like the area of concern is getting larger or changing.  Specimen for pap sent to the lab.  Will follow up per [facility] protocol.

## 2019-12-31 ENCOUNTER — Encounter: Payer: Self-pay | Admitting: *Deleted

## 2019-12-31 NOTE — Progress Notes (Signed)
Called and informed patient of her normal mammogram results.  She is to call if she feels any changes or masses and I can reassess her at that time.  Encouraged annual mammogram. Will inform patient when pap results are available.

## 2020-01-03 LAB — IGP, APTIMA HPV: HPV Aptima: NEGATIVE

## 2020-01-06 ENCOUNTER — Encounter: Payer: Self-pay | Admitting: *Deleted

## 2020-01-06 NOTE — Progress Notes (Signed)
Letter mailed to inform patient of her normal mammogram and pap smear results.  She is to follow up in 5 years with next pap smear and in one year with next mammogram.  We had previously discussed to call with any breast changes or concerns.

## 2021-09-15 ENCOUNTER — Ambulatory Visit
Admission: EM | Admit: 2021-09-15 | Discharge: 2021-09-15 | Disposition: A | Discharge status: Home / Self Care | Payer: Self-pay | Attending: Emergency Medicine | Admitting: Emergency Medicine

## 2021-09-15 ENCOUNTER — Encounter: Payer: Self-pay | Admitting: Emergency Medicine

## 2021-09-15 ENCOUNTER — Other Ambulatory Visit: Payer: Self-pay

## 2021-09-15 DIAGNOSIS — S025XXA Fracture of tooth (traumatic), initial encounter for closed fracture: Secondary | ICD-10-CM

## 2021-09-15 DIAGNOSIS — K029 Dental caries, unspecified: Secondary | ICD-10-CM

## 2021-09-15 DIAGNOSIS — S025XXG Fracture of tooth (traumatic), subsequent encounter for fracture with delayed healing: Secondary | ICD-10-CM

## 2021-09-15 MED ORDER — AMOXICILLIN 500 MG PO TABS: 500.0000 mg | ORAL_TABLET | Freq: Two times a day (BID) | ORAL | 0 refills | Status: DC

## 2021-09-15 MED ORDER — TRAMADOL HCL 50 MG PO TABS: 50.0000 mg | ORAL_TABLET | Freq: Four times a day (QID) | ORAL | 0 refills | Status: DC | PRN

## 2021-09-15 NOTE — ED Provider Notes (Signed)
[facility]    CSN: [id number] Arrival date & time: 09/15/21  3557      History   Chief Complaint Chief Complaint  Patient presents with   Oral Swelling    right    HPI Eileen Weber is a 40 y.o. female.   Rt side face swelling and tooth pain x 2 days. Pt has several dental caries but does not have a dentist. Pt has flushed area and took motrin with no relief.    History reviewed. No pertinent past medical history.  There are no problems to display for this patient.   History reviewed. No pertinent surgical history.  OB History   No obstetric history on file.      Home Medications    Prior to Admission medications   Medication Sig Start Date End Date Taking? Authorizing Provider  amoxicillin (AMOXIL) 500 MG tablet Take 1 tablet (500 mg total) by mouth 2 (two) times daily. 06/05/17   Little, Traci M, PA-C  cyclobenzaprine (FLEXERIL) 10 MG tablet Take 1 tablet (10 mg total) by mouth 2 (two) times daily as needed for muscle spasms. Do not drive while taking as can cause drowsiness 02/11/19   Renford Dills, NP  ibuprofen (ADVIL,MOTRIN) 800 MG tablet Take 1 tablet (800 mg total) by mouth every 8 (eight) hours as needed for moderate pain. 06/05/17   Little, Traci M, PA-C  meloxicam (MOBIC) 15 MG tablet Take 1 tablet (15 mg total) by mouth daily as needed. 02/11/19   Renford Dills, NP  metoCLOPramide (REGLAN) 10 MG tablet Take 1 tablet (10 mg total) by mouth 3 (three) times daily with meals. 06/13/16 06/13/17  Nita Sickle, MD  ondansetron (ZOFRAN ODT) 4 MG disintegrating tablet Take 1 tablet (4 mg total) by mouth every 8 (eight) hours as needed for nausea or vomiting. 10/27/19   Emily Filbert, MD  ondansetron (ZOFRAN ODT) 4 MG disintegrating tablet Take 1 tablet (4 mg total) by mouth every 8 (eight) hours as needed for nausea or vomiting. 10/27/19   Emily Filbert, MD    Family History Family History  Problem Relation Age of Onset   Healthy  Mother    COPD Father    Emphysema Father    Breast cancer Maternal Aunt 31       deceased @ age 60   Breast cancer Maternal Grandmother 33   Breast cancer Maternal Aunt     Social History Social History   Tobacco Use   Smoking status: Every Day    Packs/day: 1.00    Types: Cigarettes   Smokeless tobacco: Current  Vaping Use   Vaping Use: Never used  Substance Use Topics   Alcohol use: No   Drug use: Never     Allergies   Sulfa antibiotics   Review of Systems Review of Systems  Constitutional: Negative.  Negative for fever.  HENT:  Positive for dental problem.   Respiratory: Negative.    Cardiovascular: Negative.   Gastrointestinal: Negative.   Genitourinary: Negative.   Neurological: Negative.     Physical Exam Triage Vital Signs ED Triage Vitals  Enc Vitals Group     BP 09/15/21 0928 113/78     Pulse Rate 09/15/21 0928 75     Resp 09/15/21 0928 18     Temp 09/15/21 0928 98.3 F (36.8 C)     Temp Source 09/15/21 0928 Oral     SpO2 09/15/21 0928 100 %     Weight --  Height --      Head Circumference --      Peak Flow --      Pain Score 09/15/21 0924 10     Pain Loc --      Pain Edu? --      Excl. in GC? --    No data found.  Updated Vital Signs BP 113/78 (BP Location: Right Arm)   Pulse 75   Temp 98.3 F (36.8 C) (Oral)   Resp 18   LMP 08/29/2021 (Approximate)   SpO2 100%   Visual Acuity Right Eye Distance:   Left Eye Distance:   Bilateral Distance:    Right Eye Near:   Left Eye Near:    Bilateral Near:     Physical Exam Constitutional:      General: She is in acute distress.     Appearance: Normal appearance.  HENT:     Right Ear: Tympanic membrane normal.     Left Ear: Tympanic membrane normal.     Nose: Nose normal.     Mouth/Throat:     Dentition: Dental tenderness, gingival swelling and dental caries present.     Pharynx: Posterior oropharyngeal erythema present.     Tonsils: 0 on the right. 0 on the left.       Comments: Multiple dental caries, broke tooth.  Eyes:     Pupils: Pupils are equal, round, and reactive to light.  Cardiovascular:     Rate and Rhythm: Normal rate.  Pulmonary:     Effort: Pulmonary effort is normal.  Abdominal:     General: Abdomen is flat.  Neurological:     Mental Status: She is alert.     UC Treatments / Results  Labs (all labs ordered are listed, but only abnormal results are displayed) Labs Reviewed - No data to display  EKG   Radiology No results found.  Procedures Procedures (including critical care time)  Medications Ordered in UC Medications - No data to display  Initial Impression / Assessment and Plan / UC Course  I have reviewed the triage vital signs and the nursing notes.  Pertinent labs & imaging results that were available during my care of the patient were reviewed by me and considered in my medical decision making (see chart for details).     You will need to follow up with a dentist. This infection will return if you do not have tooth seen.  Take motrin as needed for pain Cont to flush the area  Take full dose of abx with foods   Final Clinical Impressions(s) / UC Diagnoses   Final diagnoses:  None   Discharge Instructions   None    ED Prescriptions   None    PDMP not reviewed this encounter.   Coralyn Mark, NP 09/15/21 [phone]

## 2021-09-15 NOTE — ED Triage Notes (Signed)
Pt presents today with c/o of right sided dental pain with facial pain that began yesterday.

## 2021-10-06 IMAGING — MG DIGITAL SCREENING BILAT W/ TOMO W/ CAD
6 of 12 series · 6 of 36 positions shown · non-contrast
Comparison: None.

CLINICAL DATA: Screening.

EXAM:
DIGITAL SCREENING BILATERAL MAMMOGRAM WITH TOMO AND CAD

[R MLO synth-2D (1 of 2)]
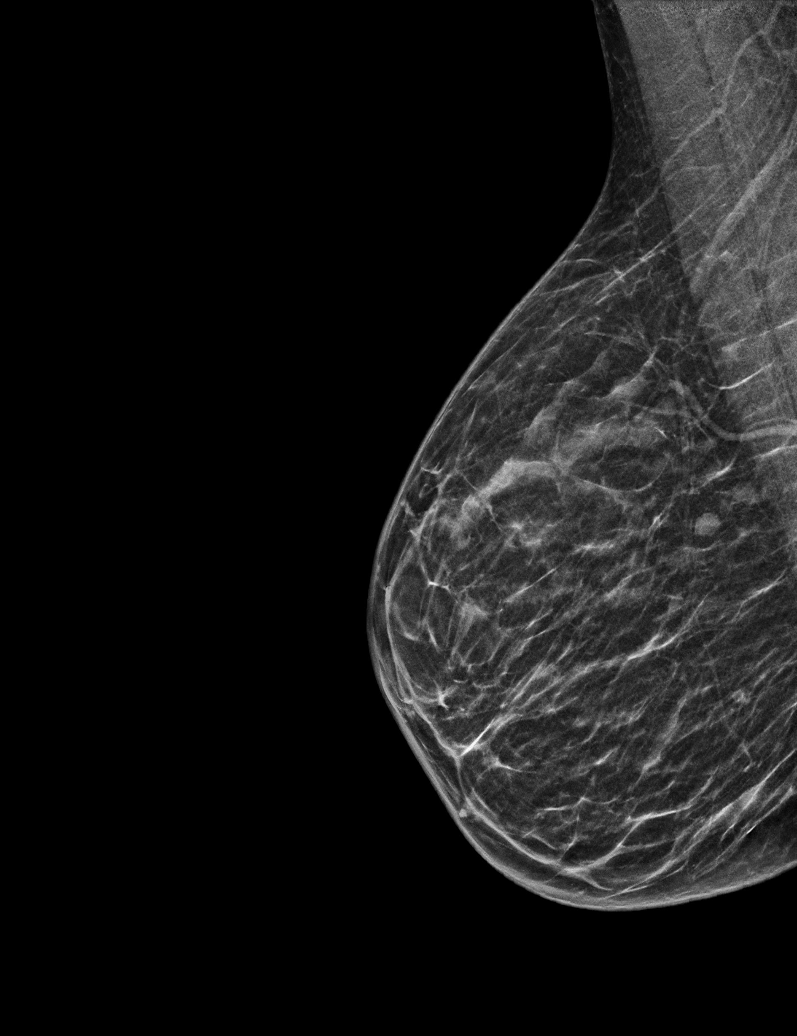

[L MLO synth-2D]
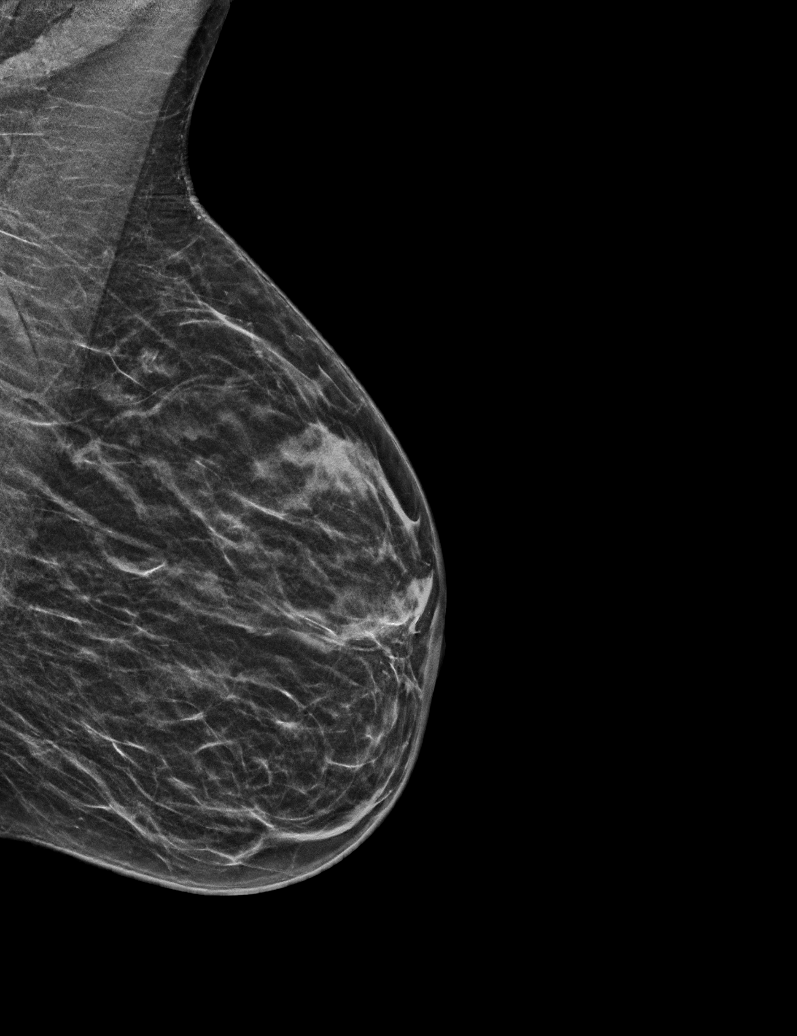

[L XCCL synth-2D]
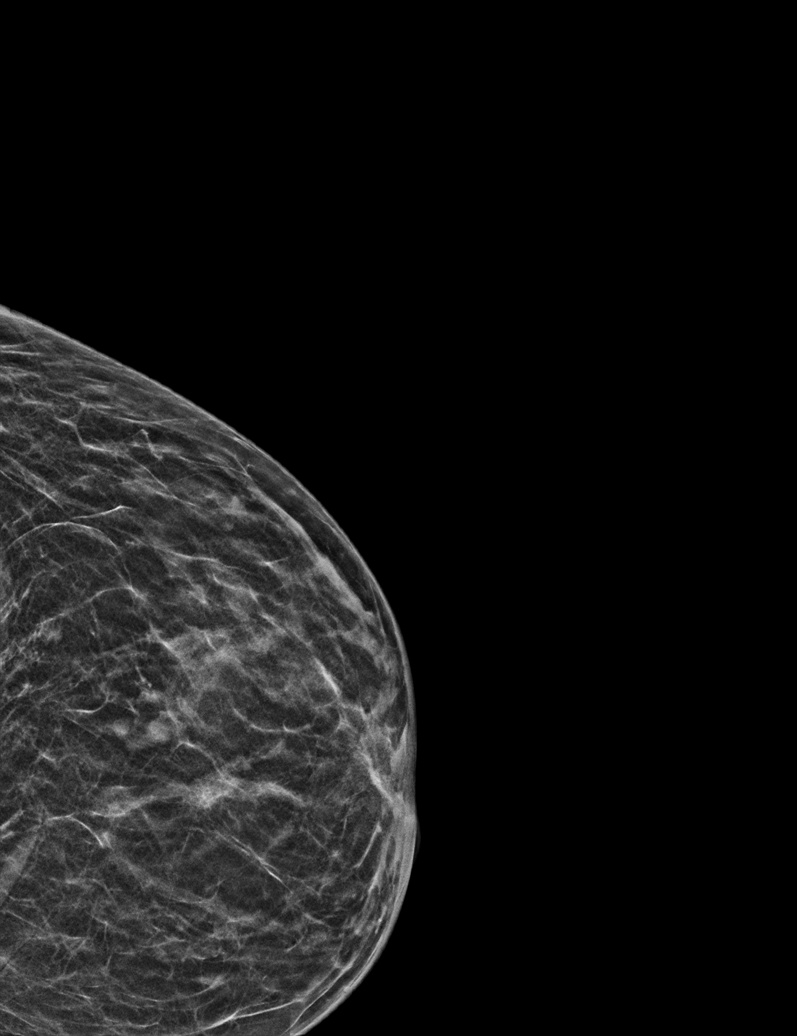

[R MLO synth-2D (2 of 2)]
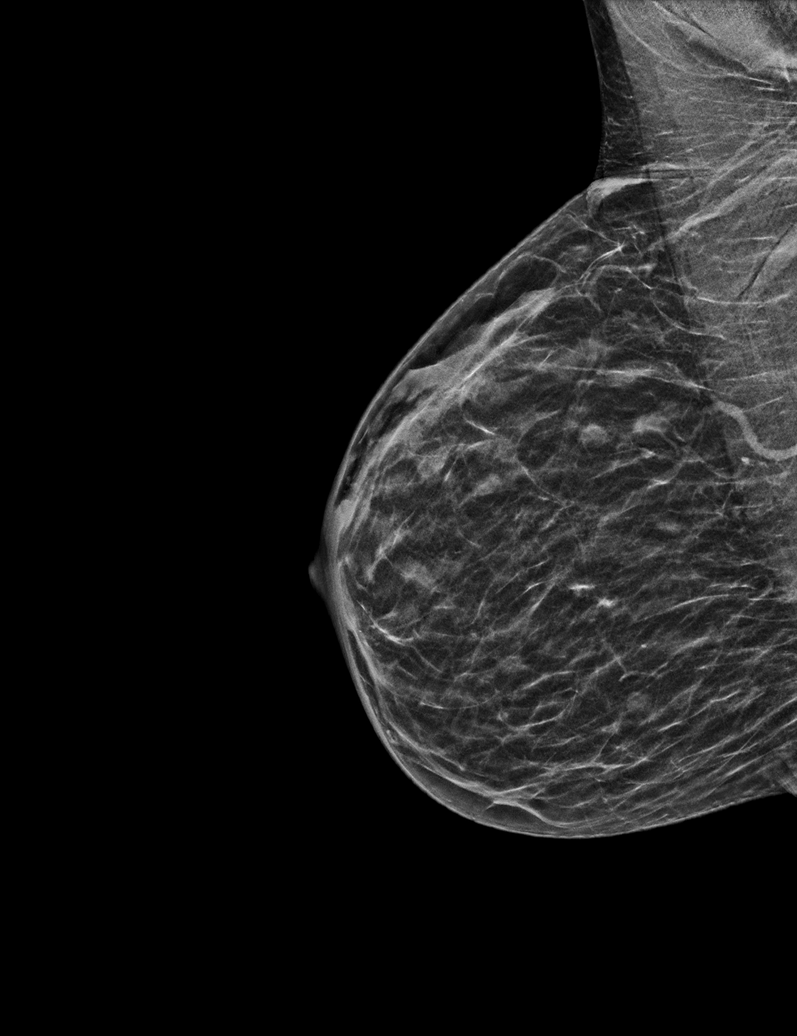

[R CC synth-2D]
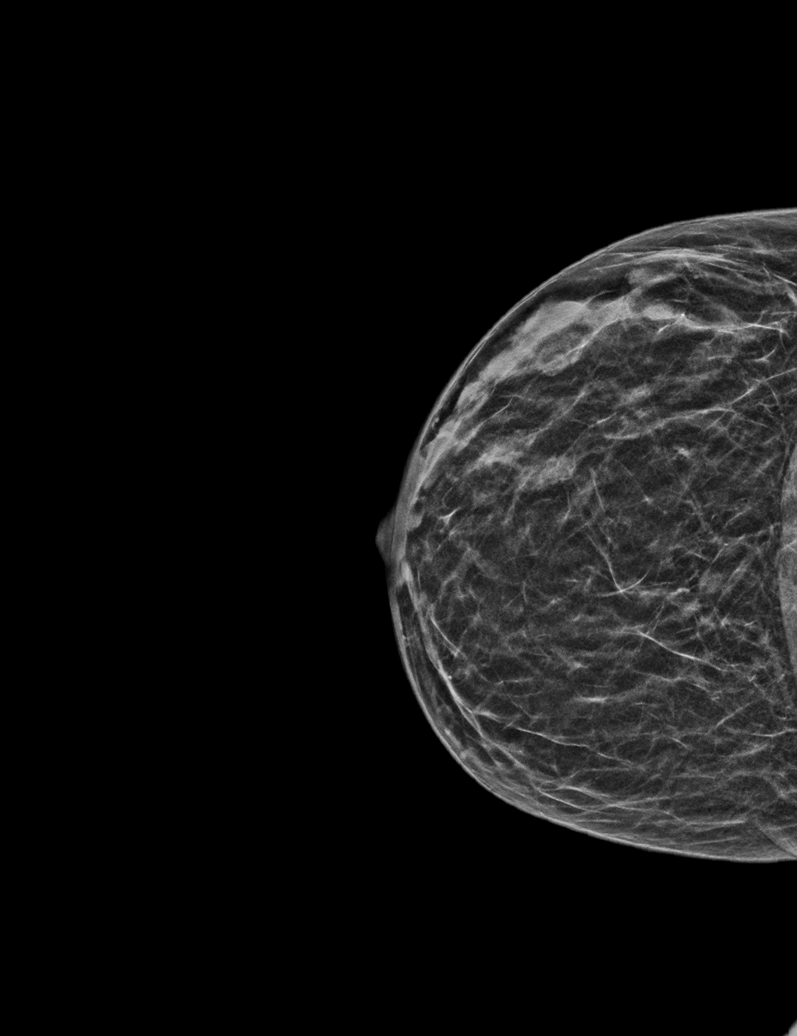

[L CC synth-2D]
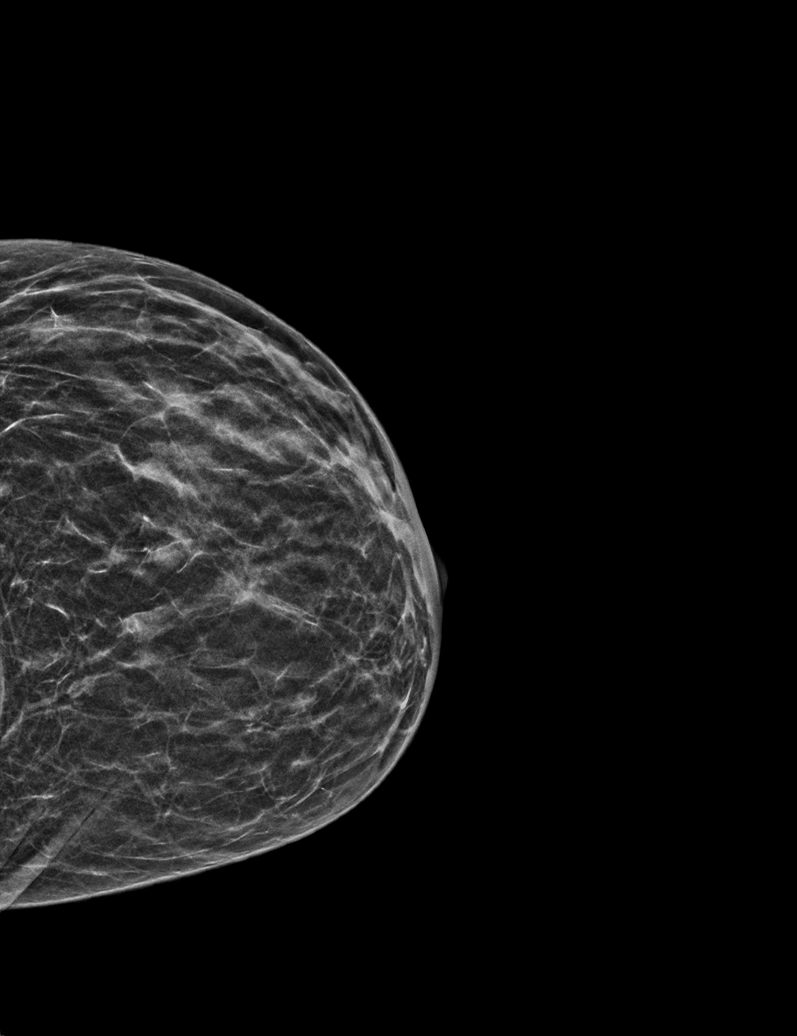

[6 of 36 positions shown; findings below may reference images not displayed]

ACR Breast Density Category b: There are scattered areas of
fibroglandular density.
FINDINGS: There are no findings suspicious for malignancy. Images were
processed with CAD.
IMPRESSION: No mammographic evidence of malignancy. A result letter of this
screening mammogram will be mailed directly to the patient.

RECOMMENDATION:
Screening mammogram in one year. (Code:Y5-G-EJ6)

BI-RADS CATEGORY  1: Negative.

## 2022-04-16 ENCOUNTER — Ambulatory Visit
Admission: EM | Admit: 2022-04-16 | Discharge: 2022-04-16 | Disposition: A | Discharge status: Home / Self Care | Payer: Self-pay | Attending: Physician Assistant | Admitting: Physician Assistant

## 2022-04-16 DIAGNOSIS — R319 Hematuria, unspecified: Secondary | ICD-10-CM | POA: Insufficient documentation

## 2022-04-16 DIAGNOSIS — N760 Acute vaginitis: Secondary | ICD-10-CM | POA: Insufficient documentation

## 2022-04-16 DIAGNOSIS — N390 Urinary tract infection, site not specified: Secondary | ICD-10-CM | POA: Insufficient documentation

## 2022-04-16 LAB — WET PREP, GENITAL
Sperm: NONE SEEN
Trich, Wet Prep: NONE SEEN
WBC, Wet Prep HPF POC: <10 (ref <10)
Yeast Wet Prep HPF POC: NONE SEEN

## 2022-04-16 LAB — URINALYSIS, MICROSCOPIC (REFLEX)
RBC / HPF: >50 RBC/hpf (ref 0–5)
WBC, UA: >50 WBC/hpf (ref 0–5)

## 2022-04-16 LAB — URINALYSIS, ROUTINE W REFLEX MICROSCOPIC
Bilirubin Urine: NEGATIVE
Glucose, UA: NEGATIVE mg/dL
Ketones, ur: NEGATIVE mg/dL
Nitrite: NEGATIVE
Protein, ur: >300 mg/dL — AB
Specific Gravity, Urine: 1.025 (ref 1.005–1.030)
pH: 6 (ref 5.0–8.0)

## 2022-04-16 MED ORDER — METRONIDAZOLE 500 MG PO TABS: 500.0000 mg | ORAL_TABLET | Freq: Two times a day (BID) | ORAL | 0 refills | Status: DC

## 2022-04-16 MED ORDER — NITROFURANTOIN MONOHYD MACRO 100 MG PO CAPS: 100.0000 mg | ORAL_CAPSULE | Freq: Two times a day (BID) | ORAL | 0 refills | Status: DC

## 2022-04-16 NOTE — ED Triage Notes (Signed)
Patient presents to UC for frequent urination and abdominal pressure -- symptoms started about a week ago.

## 2022-04-16 NOTE — ED Provider Notes (Signed)
[facility]    CSN: [id number] Arrival date & time: 04/16/22  1353      History   Chief Complaint No chief complaint on file.   HPI Eileen Weber is a 41 y.o. female presenting for 1 week history of urinary frequency, dysuria and small amount of vaginal discharge.  Denies fever.  Has had lower abdominal pain and lower back pain.  Believes she may have a UTI.  No history of UTIs.  Patient reports that she recently got a bathtub and has been taking a lot of baths.  She does not know if that has potentially lead to her condition or not.  She has no other complaints.  HPI  History reviewed. No pertinent past medical history.  There are no problems to display for this patient.   History reviewed. No pertinent surgical history.  OB History   No obstetric history on file.      Home Medications    Prior to Admission medications   Medication Sig Start Date End Date Taking? Authorizing Provider  metroNIDAZOLE (FLAGYL) 500 MG tablet Take 1 tablet (500 mg total) by mouth 2 (two) times daily. 04/16/22  Yes Shirlee Latch, PA-C  nitrofurantoin, macrocrystal-monohydrate, (MACROBID) 100 MG capsule Take 1 capsule (100 mg total) by mouth 2 (two) times daily. 04/16/22  Yes Shirlee Latch, PA-C    Family History Family History  Problem Relation Age of Onset   Healthy Mother    COPD Father    Emphysema Father    Breast cancer Maternal Aunt 80       deceased @ age 3   Breast cancer Maternal Grandmother 67   Breast cancer Maternal Aunt     Social History Social History   Tobacco Use   Smoking status: Every Day    Packs/day: 1.00    Types: Cigarettes   Smokeless tobacco: Current  Vaping Use   Vaping Use: Never used  Substance Use Topics   Alcohol use: No   Drug use: Never     Allergies   Sulfa antibiotics   Review of Systems Review of Systems  Constitutional:  Negative for chills and fever.  Gastrointestinal:  Positive for abdominal pain. Negative  for diarrhea, nausea and vomiting.  Genitourinary:  Positive for dysuria, flank pain, frequency, urgency and vaginal discharge. Negative for decreased urine volume, hematuria, pelvic pain, vaginal bleeding and vaginal pain.  Musculoskeletal:  Positive for back pain.  Skin:  Negative for rash.     Physical Exam Triage Vital Signs ED Triage Vitals  Enc Vitals Group     BP 04/16/22 1444 (!) 106/45     Pulse Rate 04/16/22 1444 83     Resp --      Temp 04/16/22 1444 98.4 F (36.9 C)     Temp Source 04/16/22 1444 Oral     SpO2 04/16/22 1444 98 %     Weight 04/16/22 1442 120 lb (54.4 kg)     Height 04/16/22 1442 5\' 6"  (1.676 m)     Head Circumference --      Peak Flow --      Pain Score 04/16/22 1441 4     Pain Loc --      Pain Edu? --      Excl. in GC? --    No data found.  Updated Vital Signs BP (!) 106/45 (BP Location: Left Arm)   Pulse 83   Temp 98.4 F (36.9 C) (Oral)   Ht 5\' 6"  (  1.676 m)   Wt 120 lb (54.4 kg)   LMP 03/29/2022   SpO2 98%   BMI 19.37 kg/m     Physical Exam Vitals and nursing note reviewed.  Constitutional:      General: She is not in acute distress.    Appearance: Normal appearance. She is not ill-appearing or toxic-appearing.  HENT:     Head: Normocephalic and atraumatic.  Eyes:     General: No scleral icterus.       Right eye: No discharge.        Left eye: No discharge.     Conjunctiva/sclera: Conjunctivae normal.  Cardiovascular:     Rate and Rhythm: Normal rate and regular rhythm.     Heart sounds: Normal heart sounds.  Pulmonary:     Effort: Pulmonary effort is normal. No respiratory distress.     Breath sounds: Normal breath sounds.  Abdominal:     Palpations: Abdomen is soft.     Tenderness: There is abdominal tenderness (RLQ, suprapubic). There is right CVA tenderness and left CVA tenderness.  Musculoskeletal:     Cervical back: Neck supple.  Skin:    General: Skin is dry.  Neurological:     General: No focal deficit present.      Mental Status: She is alert. Mental status is at baseline.     Motor: No weakness.     Gait: Gait normal.  Psychiatric:        Mood and Affect: Mood normal.        Behavior: Behavior normal.        Thought Content: Thought content normal.      UC Treatments / Results  Labs (all labs ordered are listed, but only abnormal results are displayed) Labs Reviewed  WET PREP, GENITAL - Abnormal; Notable for the following components:      Result Value   Clue Cells Wet Prep HPF POC PRESENT (*)    All other components within normal limits  URINALYSIS, ROUTINE W REFLEX MICROSCOPIC - Abnormal; Notable for the following components:   APPearance CLOUDY (*)    Hgb urine dipstick MODERATE (*)    Protein, ur >300 (*)    Leukocytes,Ua LARGE (*)    All other components within normal limits  URINALYSIS, MICROSCOPIC (REFLEX) - Abnormal; Notable for the following components:   Bacteria, UA MANY (*)    Non Squamous Epithelial PRESENT (*)    All other components within normal limits  URINE CULTURE    EKG   Radiology No results found.  Procedures Procedures (including critical care time)  Medications Ordered in UC Medications - No data to display  Initial Impression / Assessment and Plan / UC Course  I have reviewed the triage vital signs and the nursing notes.  Pertinent labs & imaging results that were available during my care of the patient were reviewed by me and considered in my medical decision making (see chart for details).  41 year old female presenting for 1 week history of dysuria, urinary frequency and vaginal discharge.  1. Vaginal discharge: Reports small amount of discharge but denies odor.  No voiced concerns for STIs.  Wet prep is positive for clue cells.  Patient does report taking a lot of baths recently.  Explained to patient that could throw off the pH balance and lead to this type of infection.  Sent metronidazole to pharmacy.  2.  Dysuria/urinary  frequency/abdominal pain/back pain: Patient is afebrile and overall well-appearing.  Mild suprapubic and right lower quadrant abdominal  tenderness and subjective bilateral CVA tenderness on exam.  UA obtained today shows moderate hemoglobin, protein and large leukocytes.  We will send urine for culture and treat for UTI at this time with Macrobid.  Resting fluid intake and rest.  Reviewed return precautions.   Final Clinical Impressions(s) / UC Diagnoses   Final diagnoses:  Urinary tract infection with hematuria, site unspecified  Acute vaginitis     Discharge Instructions      UTI: Based on either symptoms or urinalysis, you may have a urinary tract infection. We will send the urine for culture and call with results in a few days. Begin antibiotics at this time. Your symptoms should be much improved over the next 2-3 days. Increase rest and fluid intake. If for some reason symptoms are worsening or not improving after a couple of days or the urine culture determines the antibiotics you are taking will not treat the infection, the antibiotics may be changed. Return or go to ER for fever, back pain, worsening urinary pain, discharge, increased blood in urine. May take Tylenol or Motrin OTC for pain relief or consider AZO if no contraindications      ED Prescriptions     Medication Sig Dispense Auth. Provider   nitrofurantoin, macrocrystal-monohydrate, (MACROBID) 100 MG capsule Take 1 capsule (100 mg total) by mouth 2 (two) times daily. 10 capsule Eusebio Friendly B, PA-C   metroNIDAZOLE (FLAGYL) 500 MG tablet Take 1 tablet (500 mg total) by mouth 2 (two) times daily. 14 tablet Gareth Morgan      PDMP not reviewed this encounter.   Shirlee Latch, PA-C 04/16/22 1517

## 2022-04-16 NOTE — Discharge Instructions (Signed)

## 2022-04-19 LAB — URINE CULTURE: Culture: >=100000 — AB

## 2024-04-08 ENCOUNTER — Other Ambulatory Visit: Payer: Self-pay

## 2024-04-08 DIAGNOSIS — Z1231 Encounter for screening mammogram for malignant neoplasm of breast: Secondary | ICD-10-CM

## 2024-04-20 ENCOUNTER — Ambulatory Visit: Payer: Self-pay

## 2024-04-20 ENCOUNTER — Ambulatory Visit: Payer: Self-pay | Attending: Obstetrics and Gynecology

## 2024-06-09 ENCOUNTER — Ambulatory Visit (LOCAL_COMMUNITY_HEALTH_CENTER): Payer: Self-pay

## 2024-06-09 ENCOUNTER — Encounter: Payer: Self-pay | Admitting: Family Medicine

## 2024-06-09 ENCOUNTER — Ambulatory Visit: Payer: Self-pay | Attending: Obstetrics and Gynecology | Admitting: *Deleted

## 2024-06-09 ENCOUNTER — Other Ambulatory Visit: Payer: Self-pay

## 2024-06-09 ENCOUNTER — Ambulatory Visit
Admission: RE | Admit: 2024-06-09 | Discharge: 2024-06-09 | Disposition: A | Discharge status: Home / Self Care | Payer: Self-pay | Source: Ambulatory Visit | Attending: Obstetrics and Gynecology | Admitting: Obstetrics and Gynecology

## 2024-06-09 VITALS — BP 118/73 | HR 64 | Ht 66.0 in | Wt 101.6 lb

## 2024-06-09 VITALS — BP 100/68 | Ht 66.0 in | Wt 101.7 lb

## 2024-06-09 DIAGNOSIS — Z803 Family history of malignant neoplasm of breast: Secondary | ICD-10-CM

## 2024-06-09 DIAGNOSIS — Z3042 Encounter for surveillance of injectable contraceptive: Secondary | ICD-10-CM

## 2024-06-09 DIAGNOSIS — Z30013 Encounter for initial prescription of injectable contraceptive: Secondary | ICD-10-CM

## 2024-06-09 DIAGNOSIS — Z1239 Encounter for other screening for malignant neoplasm of breast: Secondary | ICD-10-CM

## 2024-06-09 DIAGNOSIS — Z1231 Encounter for screening mammogram for malignant neoplasm of breast: Secondary | ICD-10-CM | POA: Insufficient documentation

## 2024-06-09 DIAGNOSIS — Z3009 Encounter for other general counseling and advice on contraception: Secondary | ICD-10-CM

## 2024-06-09 MED ORDER — MEDROXYPROGESTERONE ACETATE 150 MG/ML IM SUSP
150.0000 mg | INTRAMUSCULAR | Status: AC
  Administered 2024-06-09 – 2024-11-18 (×3): 150 mg via INTRAMUSCULAR

## 2024-06-09 NOTE — Progress Notes (Signed)
 [facility] [address], [address] [place] [address] Main phone: [phone]  Family Planning Visit - Initial Visit  Subjective:  Eileen Weber is a 43 y.o.  G2P0   being seen today for an initial annual visit and to discuss reproductive life planning.  The patient is currently using hormonal injection for pregnancy prevention. Patient does not want a pregnancy in the next year.   Patient reports they are looking for a method with the following characteristics:  High efficacy at preventing pregnancy Method that does not involve too much memory  Patient has the following medical conditions: None per patient  Chief Complaint  Patient presents with   Annual Exam    Physical and birth control options   HPI Patient reports desire for contraception. She would like a method that doesn't involve much memory and is highly effective. She is in a new relationship after her divorce. Declines STI testing, states her new partner just left a 30 year long monogamous marriage.  Did have unprotected sex yesterday, took EC right away. Had 2 other episodes of unprotected sex in last two months and she also took EC those times. Normal pap in 2021, due March 2026.  Had mammogram this morning, no breast concerns.  Review of Systems  Constitutional:  Positive for weight loss.  All other systems reviewed and are negative. Weight loss related to extensive dental work ongoing - she is getting new dentures soon.  Diabetes screening This patient is 43 y.o. with a BMI of Body mass index is 16.4 kg/m.SABRA  Is patient eligible for diabetes screening (age >35 and BMI >25)?  no  Was Hgb A1c ordered? no  STI screening Patient reports 2 of partners in last year.  Does this patient desire STI screening?  No - declines  Cervical Cancer Screening  Result Date Procedure Results Follow-ups  12/30/2019 IGP, Aptima HPV Interpretation:  NILM Category: NIL Adequacy: ENDO Clinician Provided ICD10: Comment Performed by:: Comment Note:: Comment Test Methodology: Comment HPV Aptima: Negative    Health Maintenance Due  Topic Date Due   HIV Screening  Never done   Hepatitis C Screening  Never done   DTaP/Tdap/Td (1 - Tdap) Never done   Hepatitis B Vaccines 19-59 Average Risk (1 of 3 - 19+ 3-dose series) Never done   HPV VACCINES (1 - 3-dose SCDM series) Never done   COVID-19 Vaccine (1 - 2024-25 season) Never done   INFLUENZA VACCINE  05/15/2024   The following portions of the patient's history were reviewed and updated as appropriate: allergies, current medications, past family history, past medical history, past social history, past surgical history and problem list. Problem list updated.  See flowsheet for further details and programmatic requirements Hyperlink available at the top of the signed note in blue.  Flow sheet content below:  Pregnancy Intention Screening Does the patient want to become pregnant in the next year?: No Does the patient's partner want to become pregnant in the next year?: No Would the patient like to discuss contraceptive options today?: Yes Other:  Password: declines Is it okay to contact you by mail?: Yes Difficulty accessing hygiene products (feminine products/soap) in the last 3 months: No Contraception History Past methods of contraception used by patient:: Emergency Contraception Adverse effects associated with Emergency Contraception: none Sexual History What age did you start your period?: 9 How often do you have your period?: monthly Date of last sex?: 06/08/24 Has the patient had unprotected  sex within the last 5 days?: Yes Do you have sex with men, women, both men and women?: Men only In the past 2 months how many partners have you had sex with?: 1 In the past 12 months, how many partners have you had sex with?: 1 Is it possible that any of your sex partners in the past 12  months had sex with someone else whild they were still in a sexual relationship with you?: No What ways do you have sex?: Vaginal, Oral Do you or your partner use condoms and/or dental dams every time you have vaginal, oral or anal sex?: No Do you douche?: No Date of last HIV test?:  (many years ago during pregnancy) Have you ever had an STD?: No Have any of your partners had an STD?: No Have you or your partner ever shot up drugs?: No Have any of your partners used drugs in the past?: No Have you or your partners exchanged money or drugs for sex?: No  Objective:   Vitals:   06/09/24 1428  BP: 118/73  Pulse: 64  Weight: 101 lb 9.6 oz (46.1 kg)  Height: 5' 6 (1.676 m)   Physical Exam Vitals and nursing note reviewed.  Constitutional:      Appearance: Normal appearance.  HENT:     Head: Normocephalic.     Mouth/Throat:     Mouth: Mucous membranes are moist.  Cardiovascular:     Rate and Rhythm: Normal rate.  Pulmonary:     Effort: Pulmonary effort is normal.  Abdominal:     General: Abdomen is flat.     Palpations: Abdomen is soft. There is no mass.  Genitourinary:    Comments: Declined genital exam- no symptoms, self swabbed Musculoskeletal:        General: Normal range of motion.  Lymphadenopathy:     Head:     Right side of head: No submandibular, preauricular or posterior auricular adenopathy.     Left side of head: No submandibular, preauricular or posterior auricular adenopathy.     Cervical: No cervical adenopathy.     Upper Body:     Right upper body: No supraclavicular or axillary adenopathy.     Left upper body: No supraclavicular or axillary adenopathy.  Skin:    General: Skin is warm and dry.     Comments: Scattered purpura of palms of hands Per patient has been there for a long time and was evaluated by a provider  Neurological:     Mental Status: She is alert and oriented to person, place, and time.  Psychiatric:        Mood and Affect: Mood normal.     Assessment and Plan:  Eileen Weber is a 43 y.o. female presenting to the [facility] Department for an initial annual wellness/contraceptive visit  1. Family planning (Primary)  Contraception counseling:  Reviewed options based on patient desire and reproductive life plan. Patient is interested in Hormonal Injection. This was provided to the patient today.  Risks, benefits, and typical effectiveness rates were reviewed.  Questions were answered.  Written information was also given to the patient to review.    The patient will follow up in  3 months for surveillance.  The patient was told to call with any further questions, or with any concerns about this method of contraception.  Emphasized use of condoms 100% of the time for STI prevention.  Emergency Contraception Precautions (ECP): Patient assessed for need of ECP. She is not  a candidate based on took EC yesterday.  2. Encounter for Depo-Provera  contraception  - LMP 05/19/24, has taken EC for unprotected intercourse yesterday. Also took EC 2 other times in last 2 months. No other unprotected intercourse. - medroxyPROGESTERone  (DEPO-PROVERA ) injection 150 mg  Return in about 3 months (around 09/09/2024).  No future appointments.  Damien FORBES Satchel, NP

## 2024-06-09 NOTE — Progress Notes (Signed)
 Ms. Eileen Weber is a 43 y.o. female who presents to [facility] clinic today with no complaints.    Pap Smear: Pap smear not completed today. Last Pap smear was 12/30/2019 at [facility] clinic and was normal with negative HPV. Per patient has history of an abnormal Pap smear when she was 43 years old that a colposcopy and cryotherapy was completed for follow up. Per patient all Pap smears have been normal since cryotherapy was completed and she has had more than three normal Pap smears. Last Pap smear result is available in Epic.   Physical exam: Breasts Breasts symmetrical. No skin abnormalities bilateral breasts. No nipple retraction bilateral breasts. No nipple discharge bilateral breasts. No lymphadenopathy. No lumps palpated bilateral breasts. No complaints of pain or tenderness on exam.     MS DIGITAL SCREENING TOMO BILATERAL Result Date: 12/30/2019 CLINICAL DATA:  Screening. EXAM: DIGITAL SCREENING BILATERAL MAMMOGRAM WITH TOMO AND CAD COMPARISON:  None. ACR Breast Density Category b: There are scattered areas of fibroglandular density. FINDINGS: There are no findings suspicious for malignancy. Images were processed with CAD. IMPRESSION: No mammographic evidence of malignancy. A result letter of this screening mammogram will be mailed directly to the patient. RECOMMENDATION: Screening mammogram in one year. (Code:[id number]) BI-RADS CATEGORY  1: Negative. Electronically Signed   By: Bard Moats M.D.   On: 12/30/2019 12:40    Pelvic/Bimanual Pap is not indicated today per BCCCP guidelines.   Smoking History: Patient quit smoking cigarettes 6 months and began vaping. Discussed smoking cessation. Referred to the [facility] Quitline.   Patient Navigation: Patient education provided. Access to services provided for patient through BCCCP program.    Breast and Cervical Cancer Risk Assessment: Patient has family history of two maternal aunts having breast cancer. Patient has no known genetic mutations or history of  radiation treatment to the chest before age 45. Per patient has history of cervical dysplasia. Patient has no history of being immunocompromised or DES exposure in-utero.  Risk Scores as of Encounter on 06/09/2024     Alisa           5-year 0.6%   Lifetime 7.74%            Last calculated by Rogerio Tempie SQUIBB, LPN on 1/73/7974 at  8:35 AM        A: BCCCP exam without pap smear No complaints.  P: Referred patient to the [facility] for a screening mammogram. Appointment scheduled Tuesday, June 09, 2024 at 0940.  Driscilla Wanda SQUIBB, RN 06/09/2024 9:09 AM

## 2024-06-09 NOTE — Patient Instructions (Signed)
 Explained breast self awareness with [name]. Patient did not need a Pap smear today due to last Pap smear and HPV typing was 12/30/2019. Let her know [facility] will cover Pap smears and HPV typing every 5 years unless has a history of abnormal Pap smears. Referred patient to the [facility] for a screening mammogram. Appointment scheduled Tuesday, June 09, 2024 at 0940. Patient aware of appointment and will be there. Let patient know Raymondo will follow up with her within the next couple weeks with results of her mammogram by letter or phone. Eileen Weber verbalized understanding.  Eileen Weber, Eileen Ship, RN 9:09 AM

## 2024-06-09 NOTE — Progress Notes (Signed)
 Pt here for annual physical and initiation of birth control.  Declines STI screening.  Depo Provera  150mg  IM given in L deltoid without complications. Pt counseled to use backup method or abstain from sex x 7 days.  Verbalizes understanding.  Condoms declined.  PCP list given to patient.-Depo reminder card given.-Jenika Chiem, RN

## 2024-06-12 ENCOUNTER — Other Ambulatory Visit: Payer: Self-pay | Admitting: Obstetrics and Gynecology

## 2024-06-12 DIAGNOSIS — R928 Other abnormal and inconclusive findings on diagnostic imaging of breast: Secondary | ICD-10-CM

## 2024-06-17 ENCOUNTER — Ambulatory Visit
Admission: RE | Admit: 2024-06-17 | Discharge: 2024-06-17 | Disposition: A | Discharge status: Home / Self Care | Payer: Self-pay | Source: Ambulatory Visit | Attending: Obstetrics and Gynecology | Admitting: Obstetrics and Gynecology

## 2024-06-17 DIAGNOSIS — R928 Other abnormal and inconclusive findings on diagnostic imaging of breast: Secondary | ICD-10-CM

## 2024-08-12 ENCOUNTER — Telehealth: Payer: Self-pay | Admitting: Genetic Counselor

## 2024-08-12 NOTE — Telephone Encounter (Signed)
 LM on VM that we need to r/s the 11/25 appointment due to provider being out of town.  Please call back.

## 2024-08-26 ENCOUNTER — Ambulatory Visit: Payer: Self-pay

## 2024-08-26 VITALS — BP 119/60 | Ht 66.0 in | Wt 109.5 lb

## 2024-08-26 DIAGNOSIS — Z3042 Encounter for surveillance of injectable contraceptive: Secondary | ICD-10-CM

## 2024-08-26 DIAGNOSIS — Z3009 Encounter for other general counseling and advice on contraception: Secondary | ICD-10-CM

## 2024-08-26 DIAGNOSIS — Z30013 Encounter for initial prescription of injectable contraceptive: Secondary | ICD-10-CM

## 2024-08-26 NOTE — Addendum Note (Signed)
 Addended by: ANDREAS HEINRICH on: 08/26/2024 10:58 AM   Modules accepted: Level of Service

## 2024-08-26 NOTE — Progress Notes (Signed)
 11 Weeks   1 Day since last Depo Voices no concerns today. Patient reported her arm was really sore after previous injection and wants to try injection in hip this visit. Counseled to adhere to 11 to 13 week intervals between depo injections for optimal benefit.  Depo given today per order by E.Rosabel, NP  dated 06/09/2024.  Tolerated well Given [id number]  Next depo due 11/11/2024 has reminder card.

## 2024-09-04 ENCOUNTER — Inpatient Hospital Stay: Payer: Self-pay | Admitting: Genetic Counselor

## 2024-09-04 ENCOUNTER — Inpatient Hospital Stay: Payer: Self-pay

## 2024-09-08 ENCOUNTER — Inpatient Hospital Stay: Payer: Self-pay

## 2024-09-08 ENCOUNTER — Inpatient Hospital Stay: Payer: Self-pay | Admitting: Genetic Counselor

## 2024-10-30 NOTE — Progress Notes (Unsigned)
 REFERRING PROVIDER: Alger Gong, MD [address] [place],  [place] [address]  PRIMARY PROVIDER:  Patient, No Pcp Per  PRIMARY REASON FOR VISIT:  No diagnosis found.  HISTORY OF PRESENT ILLNESS:   Helana Macbride, a 44 y.o. female, was seen for a [place] cancer genetics consultation at the request of Gong Alger, MD due to a family history of breast cacner.  Litzy Dicker presents to clinic today to discuss the possibility of a hereditary predisposition to cancer, genetic testing, and to further clarify her future cancer risks, as well as potential cancer risks for family members.   CANCER HISTORY:  Acsa Estey is a 44 y.o. female with no personal history of cancer.    RISK FACTORS:  Menarche was at age ***.  First live birth at age ***.  OCP use for approximately {Numbers 1-12 multi-select:20307} years.  Oophorectomy: {Yes/No-Ex:120004}.  Hysterectomy: {Yes/No-Ex:120004}.  Menopausal status: {Menopause:31378}.  HRT use: {Numbers 1-12 multi-select:20307} years. Colonoscopy: {Yes/No-Ex:120004}; {normal/abnormal/not examined:14677}. Mammogram within the last year: yes (06/17/2024)  Number of breast biopsies: {Numbers 1-12 multi-select:20307}. Up to date with pelvic exams: {Yes/No-Ex:120004}. Any excessive radiation exposure or other environmental exposures the past: {Yes/No-Ex:120004} Tobacco Use: ***Current***Former***Never  No past medical history on file. No past surgical history on file. Social History   Socioeconomic History   Marital status: Married    Spouse name: Not on file   Number of children: 2   Years of education: Not on file   Highest education level: High school graduate  Occupational History   Not on file  Tobacco Use   Smoking status: Former    Current packs/day: 1.00    Types: Cigarettes, E-cigarettes   Smokeless tobacco: Never   Tobacco comments:    Current e-cigarettes  Vaping Use   Vaping status: Every Day   Substances:  Nicotine  Substance and Sexual Activity   Alcohol use: No   Drug use: Never   Sexual activity: Yes    Partners: Male    Birth control/protection: None  Other Topics Concern   Not on file  Social History Narrative   Not on file   Social Drivers of Health   Tobacco Use: Medium Risk (06/09/2024)   Patient History    Smoking Tobacco Use: Former    Smokeless Tobacco Use: Never    Passive Exposure: Not on Actuary Strain: Not on file  Food Insecurity: No Food Insecurity (06/09/2024)   Epic    Worried About Radiation Protection Practitioner of Food in the Last Year: Never true    Ran Out of Food in the Last Year: Never true  Transportation Needs: No Transportation Needs (06/09/2024)   Epic    Lack of Transportation (Medical): No    Lack of Transportation (Non-Medical): No  Physical Activity: Not on file  Stress: Not on file  Social Connections: Not on file  Depression (PHQ2-9): Low Risk (06/09/2024)   Depression (PHQ2-9)    PHQ-2 Score: 0  Alcohol Screen: Not on file  Housing: Not on file  Utilities: Not on file  Health Literacy: Not on file    FAMILY HISTORY:  We obtained a detailed, 4-generation family history pasted below.   Joline Encalada is ***aware ***unaware of relatives completing genetic testing for hereditary cancer risks.  ***Patient's maternal ancestors are of *** descent, and paternal ancestors are of *** descent.  There {IS NO:12509} reported Ashkenazi Jewish ancestry.  There ***is no known consanguinity.  Pedigree Summary*** Significant diagnoses are listed below: Family History  Problem Relation Age of Onset   Healthy Mother    COPD Father    Emphysema Father    Healthy Brother    Healthy Brother    Breast cancer Maternal Grandmother 89   Breast cancer Maternal Aunt 90       deceased @ age 26   Breast cancer Maternal Aunt     GENETIC COUNSELING ASSESSMENT: Zakeria Kulzer is a 44 y.o. female with a {Personal/family:20331} history of {cancer/polyps} which is  somewhat suggestive of a hereditary cancer predisposition syndrome*** given ***. We, therefore, discussed and recommended the following at today's visit.   DISCUSSION: We discussed that, in general, most cancer is not inherited in families, but instead is sporadic or familial. Sporadic cancers occur by chance and typically happen at older ages (>50 years) as this type of cancer is caused by genetic changes acquired during an individuals lifetime. Some families have more cancers than would be expected by chance; however, the ages or types of cancer are not consistent with a known genetic mutation or known genetic mutations have been ruled out. This type of familial cancer is thought to be due to a combination of multiple genetic, environmental, hormonal, and lifestyle factors. While this combination of factors likely increases the risk of cancer, the exact source of this risk is not currently identifiable or testable.  We discussed that 5-10% of cancer is the result of germline (heritable) genetic variants, with most cases associated with ***BRCA1/BRCA2. There are other genes that can be associated with hereditary *** cancer syndromes. These include ***. We discussed that testing is beneficial for several reasons including knowing how to follow individuals after completing their treatment, identifying whether potential treatment options ***such as PARP inhibitors*** would be beneficial, and understanding if other family members could be at risk for cancer and allow them to undergo genetic testing.   We reviewed the characteristics, features and inheritance patterns of hereditary cancer syndromes. We also discussed genetic testing, including the appropriate family members to test, the process of testing, insurance coverage and turn-around-time for results. We discussed the implications of a negative, positive, carrier and/or variant of uncertain significant result. [name]  was offered a common hereditary  cancer panel (***40 ***48 genes) and an expanded pan-cancer panel (***70***77 genes). Houa Nie was informed of the benefits and limitations of each panel, including that expanded pan-cancer panels contain genes that do not have clear management guidelines at this point in time.  We also discussed that as the number of genes included on a panel increases, the chances of variants of uncertain significance increases.  ***GENETIC TESTING NATIONAL CRITERIA: Based on Ninfa Purdie's {Personal/family:20331} history of cancer she meets medical criteria for genetic testing based on the [employer] (NCCN) guidelines. ***Though Quorra Rosene is not personally affected, there are no affected family members that are willing/able/available to undergo hereditary cancer testing. Therefore, Zarahi Fuerst the most informative family member available. ***Despite that she meets criteria, she may still have an out of pocket cost.  ***Despite that she meets criteria, she  may still have an out of pocket cost. she completed the [facility] patient assistance form in the office and qualified for $0 out of post cost. We discussed that if her out of pocket cost for testing is over $100, the laboratory will send a text with the estimated out-of-pocket cost.  If the out of pocket cost of testing is less than $100 she will be billed by the genetic testing laboratory.   GENETIC  TESTING CONSENT:  After considering the risks, benefits, and limitations, Jerzee Jerome ***did NOT provide***provided informed consent to pursue genetic testing. A blood sample was sent to [facility] for analysis of the ***CancerNext-Expanded+RNA Panel. Results should be available within approximately ***2-3 weeks' time, at which point they will be disclosed by telephone to [name] , as will any additional recommendations warranted by these results. Earnstine Meinders will receive a summary of her genetic counseling visit and a copy of  her results once available. This information will also be available in Epic.  ***{INSERT}.jrcambry ***{INSERT} .jrcinvit  ***DOES NOT MEET NCCN CRITERIA ***We discussed with [name] that the {Personal/family:20331} history does not meet insurance or NCCN criteria for genetic testing and, therefore, is not highly consistent with a familial hereditary cancer syndrome.  We feel she is at low risk to harbor  a gene mutation associated with such a condition. Thus, we did not recommend any genetic testing, at this time, and recommended Jacqualine Weichel continue to follow the cancer screening guidelines given by her primary healthcare provider.  ***STAT TESTING ***We reviewed the characteristics, features and inheritance patterns of hereditary cancer syndromes. We also discussed genetic testing, including the appropriate family members to test, the process of testing, insurance coverage and turn-around-time for results. We discussed the implications of a negative, positive and/or variant of uncertain significant result. In order to get genetic test results in a timely manner so that Lemoyne Scarpati can use these genetic test results for surgical decisions, we recommended Florene Brill pursue genetic testing for the ***. Once complete, we recommend Champagne Paletta pursue reflex genetic testing to the *** gene panel.   GENETIC INFORMATION NONDISCRIMINATION ACT (GINA): We discussed that some people do not want to undergo genetic testing due to fear of genetic discrimination.  The Genetic Information Nondiscrimination Act (GINA) was signed into federal law in 2008. GINA prohibits health insurers and most employers from discriminating against individuals based on genetic information (including the results of genetic tests and family history information). According to GINA, health insurance companies cannot consider genetic information to be a preexisting condition, nor can they use it to make decisions regarding coverage or  rates. GINA also makes it illegal for most employers to use genetic information in making decisions about hiring, firing, promotion, or terms of employment. It is important to note that GINA does not offer protections for life insurance, disability insurance, or long-term care insurance. GINA does not apply to those in the [employer], those who work for companies with less than 15 employees, and new life insurance or long-term disability insurance policies.  Health status due to a cancer diagnosis is not protected under GINA. More information about GINA can be found by visiting [email or web address].be. Lastly, we encouraged Shanikka Wonders to remain in contact with cancer genetics annually so that we can continuously update the family history and inform her of any changes in cancer genetics and testing that may be of benefit for this family.   Jul[facility] questions were answered to her satisfaction today. Our contact information was provided should additional questions or concerns arise. Thank you for the referral and allowing us  to share in the care of your patient.   Resources:  Glenn Christo was provided with the following:  ***[facility] Billing information  ***[facility] Hereditary Cancer Testing Patient Guide ***[facility] Patient Assistance Information Sheet ***[facility] CancerNext-Expanded + RNAinsight gene list  PLAN:  ***Testing Ordered: ***Clinic Note Faxed/Routed to Lauraine Plain PCP ***Patient, No Pcp Per  ***[facility] Patient Assistance completed during visit (OOP ***$0) ***Declined Testing *** Despite our recommendation, Preslynn Bier did not wish to pursue genetic testing at today's visit. We understand this decision and remain available to coordinate genetic testing at any time in the future. We, therefore, recommend Dolly Harbach continue to follow the cancer screening guidelines given by her primary healthcare provider.  ***MOST INFORMATIVE PERSON TO TEST ***Based on Elli  Bordley's family history, we recommended her ***, who was diagnosed with *** at age ***, have genetic counseling and testing. Narcissa Melder will let us  know if we can be of any assistance in coordinating genetic counseling and/or testing for this family member.   Santana Fryer, MS, CGC  Certified Genetic Counselor  Email: [email or web address]  Phone: [phone]  I personally spent a total of *** minutes in the care of the patient today including {Time Based Coding:210964241}.  *** The patient was seen alone.  ***The patient was joined by ***. Drs. Lanny Stalls, and/or Gudena were available for questions, if needed. _______________________________________________________________________ For Office Staff:  Number of people involved in session: *** Was an Intern/ student involved with case: {YES/NO:63}

## 2024-11-02 ENCOUNTER — Inpatient Hospital Stay: Payer: Self-pay

## 2024-11-11 ENCOUNTER — Ambulatory Visit: Payer: Self-pay

## 2024-11-18 ENCOUNTER — Ambulatory Visit: Payer: Self-pay

## 2024-11-18 VITALS — BP 128/58 | Ht 69.0 in | Wt 115.5 lb

## 2024-11-18 DIAGNOSIS — Z3042 Encounter for surveillance of injectable contraceptive: Secondary | ICD-10-CM

## 2024-11-18 DIAGNOSIS — Z3009 Encounter for other general counseling and advice on contraception: Secondary | ICD-10-CM

## 2024-11-18 NOTE — Progress Notes (Signed)
 In nurse clinic for depo injection today Last depo 12 weeks ago Voices no concerns. Depo given today per order from CHARLENA Satchel, NP dated 06/09/24 Depo given IM RUOQ Next depo due 02/03/25-reminder card given

## 2024-12-14 ENCOUNTER — Inpatient Hospital Stay: Payer: Self-pay
